# Patient Record
Sex: Female | Born: 1970 | Race: Black or African American | Hispanic: No | Marital: Married | Smoking: Never smoker
Health system: Southern US, Community
[De-identification: ages and names within clinical notes are randomized; demographics above are authoritative.]

## PROBLEM LIST (undated history)

## (undated) DIAGNOSIS — M1990 Unspecified osteoarthritis, unspecified site: Secondary | ICD-10-CM

## (undated) DIAGNOSIS — Z91018 Allergy to other foods: Secondary | ICD-10-CM

## (undated) DIAGNOSIS — R0602 Shortness of breath: Secondary | ICD-10-CM

## (undated) DIAGNOSIS — R7303 Prediabetes: Secondary | ICD-10-CM

## (undated) DIAGNOSIS — E119 Type 2 diabetes mellitus without complications: Secondary | ICD-10-CM

## (undated) DIAGNOSIS — T7840XA Allergy, unspecified, initial encounter: Secondary | ICD-10-CM

## (undated) DIAGNOSIS — I10 Essential (primary) hypertension: Secondary | ICD-10-CM

## (undated) DIAGNOSIS — E669 Obesity, unspecified: Secondary | ICD-10-CM

## (undated) DIAGNOSIS — M2550 Pain in unspecified joint: Secondary | ICD-10-CM

## (undated) DIAGNOSIS — R519 Headache, unspecified: Secondary | ICD-10-CM

## (undated) DIAGNOSIS — J45909 Unspecified asthma, uncomplicated: Secondary | ICD-10-CM

## (undated) DIAGNOSIS — N979 Female infertility, unspecified: Secondary | ICD-10-CM

## (undated) DIAGNOSIS — M549 Dorsalgia, unspecified: Secondary | ICD-10-CM

## (undated) DIAGNOSIS — K219 Gastro-esophageal reflux disease without esophagitis: Secondary | ICD-10-CM

## (undated) DIAGNOSIS — G43909 Migraine, unspecified, not intractable, without status migrainosus: Secondary | ICD-10-CM

## (undated) DIAGNOSIS — Z8719 Personal history of other diseases of the digestive system: Secondary | ICD-10-CM

## (undated) DIAGNOSIS — D649 Anemia, unspecified: Secondary | ICD-10-CM

## (undated) HISTORY — DX: Female infertility, unspecified: N97.9

## (undated) HISTORY — DX: Allergy, unspecified, initial encounter: T78.40XA

## (undated) HISTORY — DX: Prediabetes: R73.03

## (undated) HISTORY — DX: Unspecified osteoarthritis, unspecified site: M19.90

## (undated) HISTORY — DX: Migraine, unspecified, not intractable, without status migrainosus: G43.909

## (undated) HISTORY — PX: WISDOM TOOTH EXTRACTION: SHX21

## (undated) HISTORY — DX: Allergy to other foods: Z91.018

## (undated) HISTORY — DX: Anemia, unspecified: D64.9

## (undated) HISTORY — DX: Shortness of breath: R06.02

## (undated) HISTORY — DX: Dorsalgia, unspecified: M54.9

## (undated) HISTORY — DX: Pain in unspecified joint: M25.50

## (undated) HISTORY — DX: Gastro-esophageal reflux disease without esophagitis: K21.9

---

## 2014-07-16 DIAGNOSIS — R600 Localized edema: Secondary | ICD-10-CM | POA: Insufficient documentation

## 2014-07-16 DIAGNOSIS — J302 Other seasonal allergic rhinitis: Secondary | ICD-10-CM | POA: Insufficient documentation

## 2014-08-03 DIAGNOSIS — N920 Excessive and frequent menstruation with regular cycle: Secondary | ICD-10-CM | POA: Insufficient documentation

## 2014-08-03 DIAGNOSIS — G44209 Tension-type headache, unspecified, not intractable: Secondary | ICD-10-CM | POA: Insufficient documentation

## 2015-06-03 DIAGNOSIS — J9801 Acute bronchospasm: Secondary | ICD-10-CM | POA: Insufficient documentation

## 2015-06-17 DIAGNOSIS — E1165 Type 2 diabetes mellitus with hyperglycemia: Secondary | ICD-10-CM | POA: Insufficient documentation

## 2015-09-15 DIAGNOSIS — E785 Hyperlipidemia, unspecified: Secondary | ICD-10-CM | POA: Insufficient documentation

## 2016-04-24 ENCOUNTER — Encounter (HOSPITAL_COMMUNITY): Payer: Self-pay

## 2016-04-24 ENCOUNTER — Emergency Department (HOSPITAL_COMMUNITY)
Admission: EM | Admit: 2016-04-24 | Discharge: 2016-04-24 | Disposition: A | Discharge status: Home / Self Care | Payer: Self-pay | Attending: Dermatology | Admitting: Dermatology

## 2016-04-24 DIAGNOSIS — R51 Headache: Secondary | ICD-10-CM | POA: Insufficient documentation

## 2016-04-24 DIAGNOSIS — I10 Essential (primary) hypertension: Secondary | ICD-10-CM | POA: Insufficient documentation

## 2016-04-24 DIAGNOSIS — Z5321 Procedure and treatment not carried out due to patient leaving prior to being seen by health care provider: Secondary | ICD-10-CM | POA: Insufficient documentation

## 2016-04-24 HISTORY — DX: Essential (primary) hypertension: I10

## 2016-04-24 NOTE — ED Notes (Signed)
Not found in lobby x 3

## 2016-04-24 NOTE — ED Triage Notes (Signed)
Pt had headache and took excedrin tension medication.   Took with very small amount of water.  Felt pill get stuck at bottom of throat.  Pt started having burning in throat and upper chest. States episode lasted about a minute.  Pt has not had any pain since then.  Headache has even improved.  After mentioning to mother, mom told her to come here.  No pain or difficulty swallowing at this time.

## 2016-07-20 DIAGNOSIS — F4322 Adjustment disorder with anxiety: Secondary | ICD-10-CM | POA: Insufficient documentation

## 2016-11-02 DIAGNOSIS — M25561 Pain in right knee: Secondary | ICD-10-CM | POA: Insufficient documentation

## 2016-12-03 ENCOUNTER — Telehealth (INDEPENDENT_AMBULATORY_CARE_PROVIDER_SITE_OTHER): Payer: Self-pay

## 2016-12-03 NOTE — Telephone Encounter (Signed)
Patient would like a Rx refill of Vimovo.  Stated that she was getting [facility]  CB# is [phone].  Please Advise.  Thank You.

## 2016-12-03 NOTE — Telephone Encounter (Signed)
Ok to refill Vimovo?

## 2016-12-04 MED ORDER — NAPROXEN-ESOMEPRAZOLE 500-20 MG PO TBEC: DELAYED_RELEASE_TABLET | ORAL | 0 refills | Status: DC

## 2016-12-04 NOTE — Telephone Encounter (Signed)
Vimovo entered into system.I left voicemail for patient advising.

## 2016-12-04 NOTE — Telephone Encounter (Signed)
Ok thanks 

## 2017-06-17 ENCOUNTER — Other Ambulatory Visit: Payer: Self-pay

## 2017-06-17 ENCOUNTER — Emergency Department (HOSPITAL_COMMUNITY): Payer: 59

## 2017-06-17 ENCOUNTER — Encounter (HOSPITAL_COMMUNITY): Payer: Self-pay | Admitting: *Deleted

## 2017-06-17 ENCOUNTER — Emergency Department (HOSPITAL_COMMUNITY)
Admission: EM | Admit: 2017-06-17 | Discharge: 2017-06-18 | Disposition: A | Discharge status: Home / Self Care | Payer: 59 | Attending: Emergency Medicine | Admitting: Emergency Medicine

## 2017-06-17 DIAGNOSIS — R51 Headache: Secondary | ICD-10-CM | POA: Insufficient documentation

## 2017-06-17 DIAGNOSIS — I10 Essential (primary) hypertension: Secondary | ICD-10-CM | POA: Diagnosis present

## 2017-06-17 DIAGNOSIS — Z79899 Other long term (current) drug therapy: Secondary | ICD-10-CM | POA: Diagnosis not present

## 2017-06-17 HISTORY — DX: Obesity, unspecified: E66.9

## 2017-06-17 LAB — BASIC METABOLIC PANEL
Anion gap: 7 (ref 5–15)
BUN: 11 mg/dL (ref 6–20)
CALCIUM: 8.7 mg/dL — AB (ref 8.9–10.3)
CHLORIDE: 102 mmol/L (ref 101–111)
CO2: 29 mmol/L (ref 22–32)
CREATININE: 0.94 mg/dL (ref 0.44–1.00)
GFR calc non Af Amer: >60 mL/min (ref >60)
GLUCOSE: 180 mg/dL — AB (ref 65–99)
Potassium: 3.5 mmol/L (ref 3.5–5.1)
Sodium: 138 mmol/L (ref 135–145)

## 2017-06-17 LAB — CBC
HCT: 40.3 % (ref 36.0–46.0)
Hemoglobin: 13 g/dL (ref 12.0–15.0)
MCH: 27.5 pg (ref 26.0–34.0)
MCHC: 32.3 g/dL (ref 30.0–36.0)
MCV: 85.4 fL (ref 78.0–100.0)
Platelets: 276 10*3/uL (ref 150–400)
RBC: 4.72 MIL/uL (ref 3.87–5.11)
RDW: 13.5 % (ref 11.5–15.5)
WBC: 10.2 10*3/uL (ref 4.0–10.5)

## 2017-06-17 MED ORDER — LABETALOL HCL 5 MG/ML IV SOLN
10.0000 mg | Freq: Once | INTRAVENOUS | Status: AC
  Administered 2017-06-17: 10 mg via INTRAVENOUS
  Filled 2017-06-17: qty 4

## 2017-06-17 MED ORDER — LOSARTAN POTASSIUM 100 MG PO TABS: 100.0000 mg | ORAL_TABLET | Freq: Every day | ORAL | 0 refills | Status: DC

## 2017-06-17 NOTE — ED Triage Notes (Signed)
Pt reports going to pcp today due to headaches and sent here due to SBP > 200. Reports taking meds as prescribed for HTN.

## 2017-06-17 NOTE — ED Notes (Signed)
Patient transported to CT 

## 2017-06-17 NOTE — ED Notes (Signed)
Patient told to go to ED from her PCP office for further eval of high BP - pt states she had been taking her BP medication, but recently changed the time she was taking it. She states she's been waking up with headaches every morning for about 9-10 days and has been taking about 4 BC powders each day for that and for arthritic pain. Pt denies blurred vision/n/v, no dizziness/lightheadedness.

## 2017-06-17 NOTE — Discharge Instructions (Signed)
It was my pleasure taking care of you today!   We are going to increase your Losartan to 100mg  daily. I have given you a prescription for this. Continue taking all your other home medications as directed.   Call your primary care doctor in the morning to schedule a follow up appointment.   Return to ER for new or worsening symptoms, any additional concerns.

## 2017-06-17 NOTE — ED Provider Notes (Signed)
[facility] EMERGENCY [facility] Provider Note   CSN: [id number] Arrival date & time: 06/17/17  1426     History   Chief Complaint Chief Complaint  Patient presents with  . Hypertension    HPI Maureen Flores is a 46 y.o. female.  The history is provided by the patient and medical records. No language interpreter was used.     Maureen Flores is a 46 y.o. female  with a PMH of HTN, obesity who presents to the Emergency Department from PCP office. Patient states that over the last week, she has been waking up with headaches. She will take ibuprofen or BC and headache will resolve. She will have intermittent headaches throughout the day, but states they are very mild, not requiring medication and more of an "annoyance". When she saw her PCP today, she notes blood pressure was markedly elevated with systolic BP in the 200's. She states that her BP normally is 140-150's/90's range. She is on Bystolic, losartan and triamterene for BP which she has been taking regularly. 1.5 weeks ago, she started taking medication in the morning instead of at night because diuretic was causing her to wake up several times to use the bathroom during the night, but she still denies any days where she has missed her medication. No chest pain, shortness of breath, abdominal pain, back pain, blurred vision / visual changes.   Past Medical History:  Diagnosis Date  . Hypertension   . Obesity     There are no active problems to display for this patient.   History reviewed. No pertinent surgical history.  OB History    No data available       Home Medications    Prior to Admission medications   Medication Sig Start Date End Date Taking? Authorizing Provider  losartan (COZAAR) 100 MG tablet Take 1 tablet (100 mg total) daily by mouth. 06/17/17   Axil Copeman, Chase PicketJaime Pilcher, PA-C  Naproxen-Esomeprazole 500-20 MG TBEC Take one tablet every 12 hours with food as needed. 12/04/16   Eldred MangesYates, Mark C, MD     Family History History reviewed. No pertinent family history.  Social History Social History   Tobacco Use  . Smoking status: Never Smoker  . Smokeless tobacco: Never Used  Substance Use Topics  . Alcohol use: Yes    Comment: social  . Drug use: No     Allergies   Patient has no known allergies.   Review of Systems Review of Systems  Neurological: Positive for headaches. Negative for dizziness, speech difficulty, weakness, light-headedness and numbness.  All other systems reviewed and are negative.    Physical Exam Updated Vital Signs BP (!) 205/92   Pulse 63   Temp 98.8 F (37.1 C) (Oral)   Resp 17   SpO2 96%   Physical Exam  Constitutional: She is oriented to person, place, and time. She appears well-developed and well-nourished. No distress.  HENT:  Head: Normocephalic and atraumatic.  Cardiovascular: Normal rate, regular rhythm and normal heart sounds.  No murmur heard. Pulmonary/Chest: Effort normal and breath sounds normal. No respiratory distress.  Abdominal: Soft. She exhibits no distension. There is no tenderness.  Musculoskeletal: She exhibits no edema.  Neurological: She is alert and oriented to person, place, and time.  Speech clear and goal oriented. CN 2-12 grossly intact. Normal finger-to-nose and rapid alternating movements. No drift. Strength and sensation intact. Steady gait.  Skin: Skin is warm and dry.  Nursing note and vitals reviewed.  ED Treatments / Results  Labs (all labs ordered are listed, but only abnormal results are displayed) Labs Reviewed  BASIC METABOLIC PANEL - Abnormal; Notable for the following components:      Result Value   Glucose, Bld 180 (*)    Calcium 8.7 (*)    All other components within normal limits  CBC    EKG  EKG Interpretation None       Radiology Ct Head Wo Contrast  Result Date: 06/17/2017 CLINICAL DATA:  Headaches every morning this week. Elevated blood pressure at primary care  physician. EXAM: CT HEAD WITHOUT CONTRAST TECHNIQUE: Contiguous axial images were obtained from the base of the skull through the vertex without intravenous contrast. COMPARISON:  None. FINDINGS: Brain: No evidence of acute infarction, hemorrhage, hydrocephalus, extra-axial collection or mass lesion/mass effect. Vascular: Vascular calcifications in the internal carotid vessels. Skull: Calvarium appears intact. Sinuses/Orbits: Mucosal thickening of the left paranasal sinuses. No acute air-fluid levels. Mastoid air cells are not opacified. Other: None. IMPRESSION: No acute intracranial abnormalities. Chronic appearing inflammatory changes in the left paranasal sinuses. Electronically Signed   By: Burman NievesWilliam  Stevens M.D.   On: 06/17/2017 22:45    Procedures Procedures (including critical care time)  Medications Ordered in ED Medications  labetalol (NORMODYNE,TRANDATE) injection 10 mg (10 mg Intravenous Given 06/17/17 2256)     Initial Impression / Assessment and Plan / ED Course  I have reviewed the triage vital signs and the nursing notes.  Pertinent labs & imaging results that were available during my care of the patient were reviewed by me and considered in my medical decision making (see chart for details).    Maureen Flores is a 46 y.o. female who presents to ED from PCP for hypertension associated with morning headaches x 1 week. No focal neuro deficits on exam. Labs with no evidence of end-organ damage. CT head negative. Dose of labetalol given and BP down to 160's/90's. Patient on 50mg  losartan along with two other anti-hypertensive agents. Will increase to 100mg . Patient has PCP whom she follows up with regularly and feels comfortable with plan to discharge home with close PCP follow up. Reasons to return to ER discussed. All questions answered.   Patient discussed with Dr. Effie ShyWentz who agrees with treatment plan.   Final Clinical Impressions(s) / ED Diagnoses   Final diagnoses:  Essential  hypertension    ED Discharge Orders        Ordered    losartan (COZAAR) 100 MG tablet  Daily     06/17/17 2345       Teliah Buffalo, Chase PicketJaime Pilcher, PA-C 06/17/17 2353    Mancel BaleWentz, Elliott, MD 06/18/17 [phone]

## 2017-08-21 DIAGNOSIS — G8929 Other chronic pain: Secondary | ICD-10-CM | POA: Insufficient documentation

## 2017-11-20 IMAGING — CT CT HEAD W/O CM
4 series · 16 of 47 positions shown, 18 images · non-contrast
Comparison: None.

CLINICAL DATA: Headaches every morning this week. Elevated blood
pressure at primary care physician.

EXAM:
CT HEAD WITHOUT CONTRAST
TECHNIQUE: Contiguous axial images were obtained from the base of the skull
through the vertex without intravenous contrast.

[Series 3: head wo · axial · 0.47mm/px · z∈[+964,+1080]mm · 7 of 31 slices shown, 9 images]
[im 4/31  brain]
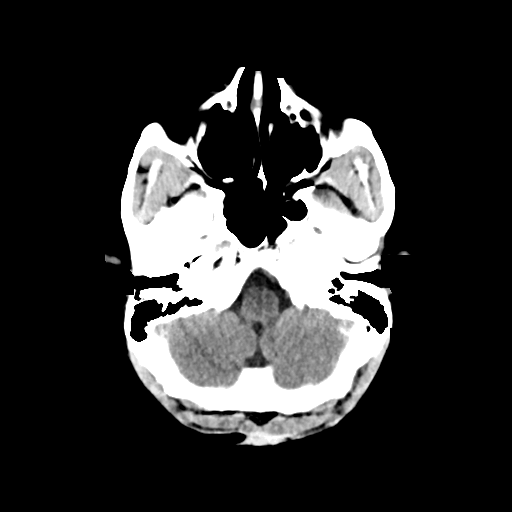
[im 4/31  bone]
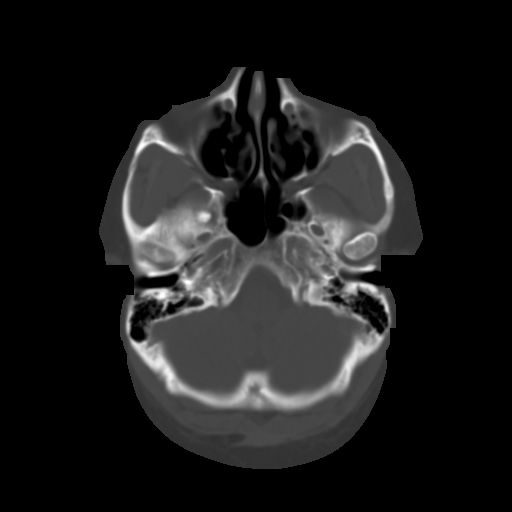
[im 8/31  brain]
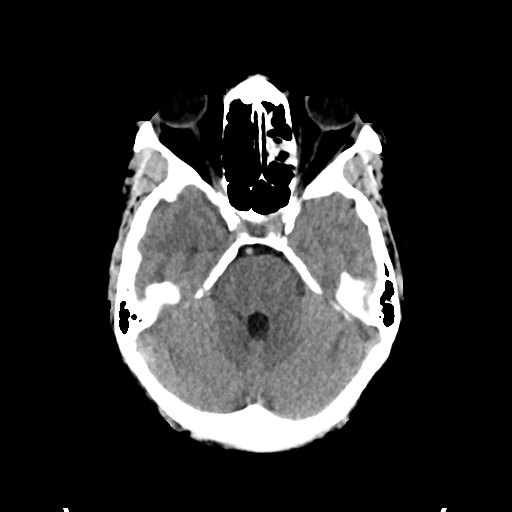
[im 12/31  brain]
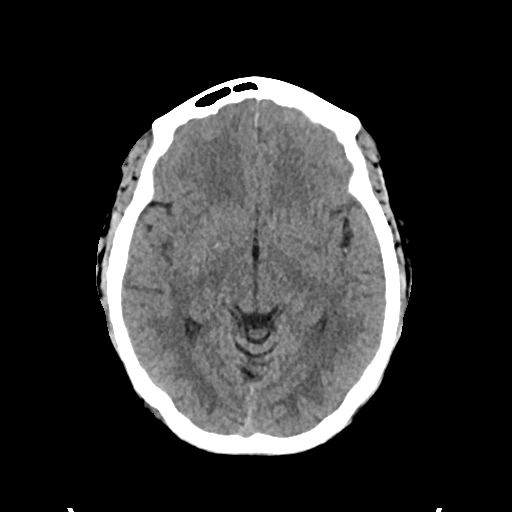
[im 16/31  brain]
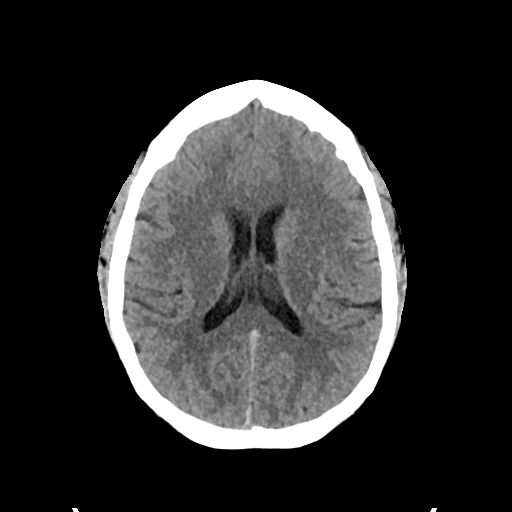
[im 19/31  brain]
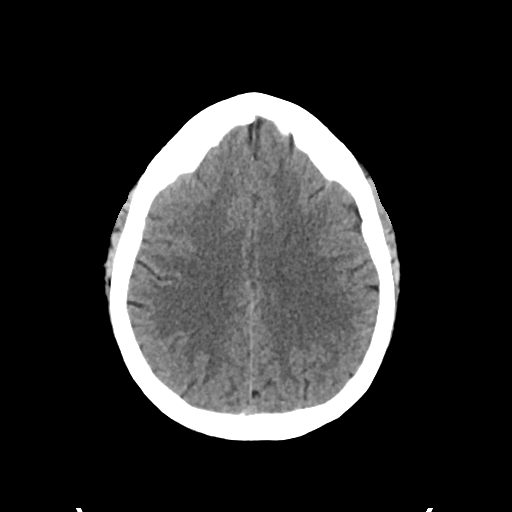
[im 19/31  bone]
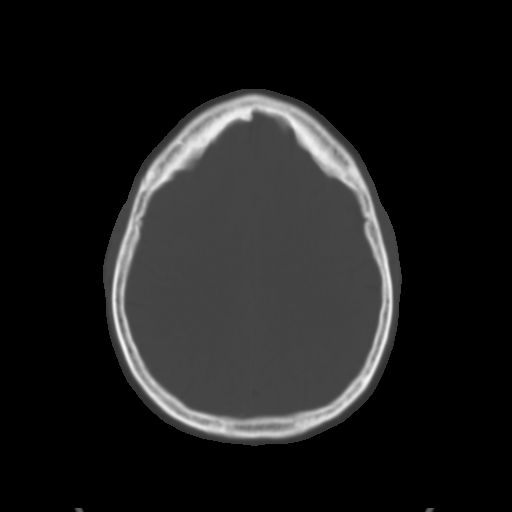
[im 23/31  brain]
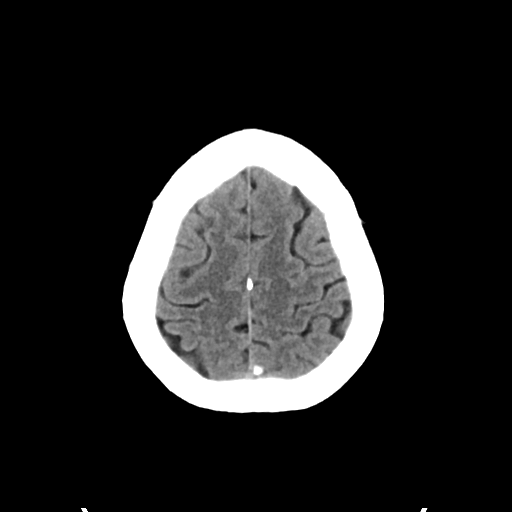
[im 27/31  brain]
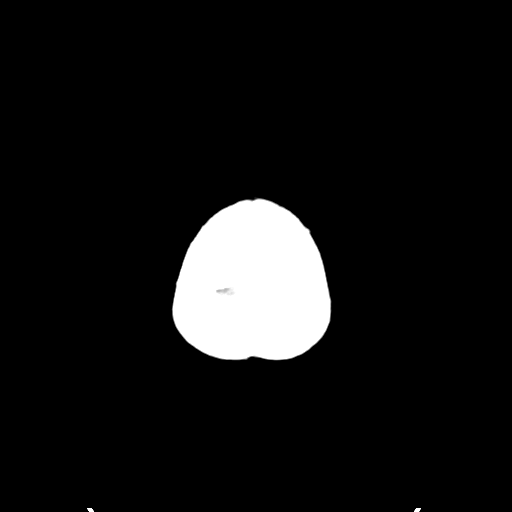

[Series 4: head bone · axial · 0.47mm/px · z∈[+964,+994]mm · 3 of 76 slices shown]
[im 8/76  bone]
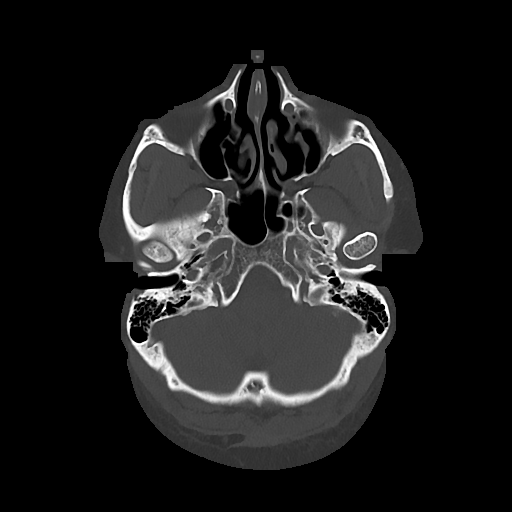
[im 16/76  bone]
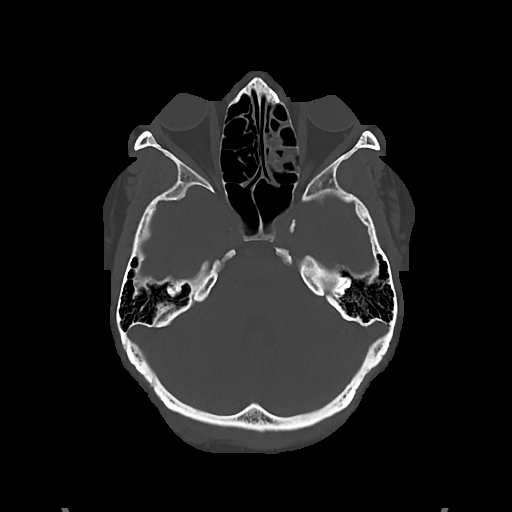
[im 23/76  bone]
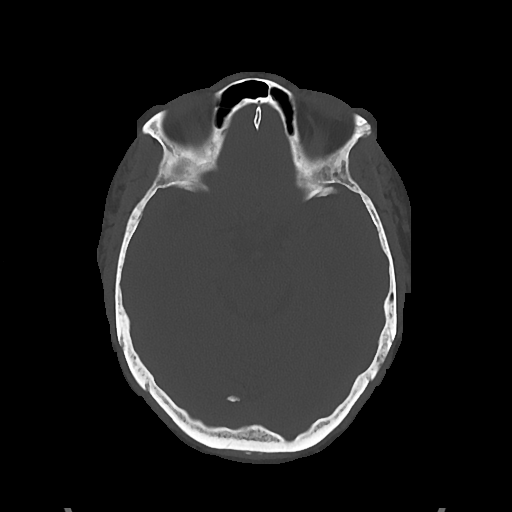

[Series 5: cor soft · coronal · 0.30mm/px · 3 of 71 slices shown]
[im 24/71  brain]
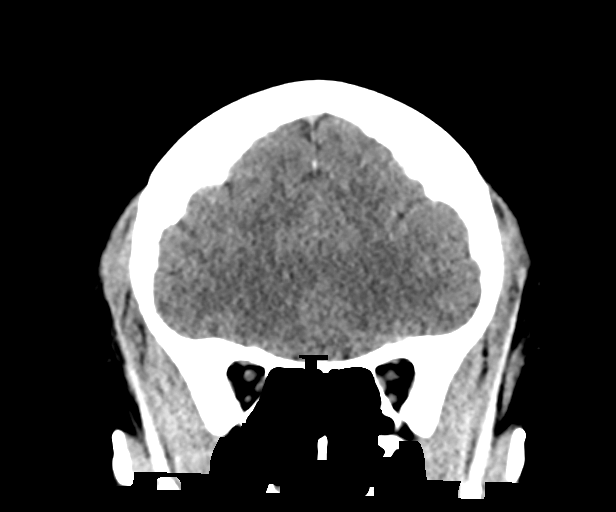
[im 32/71  brain]
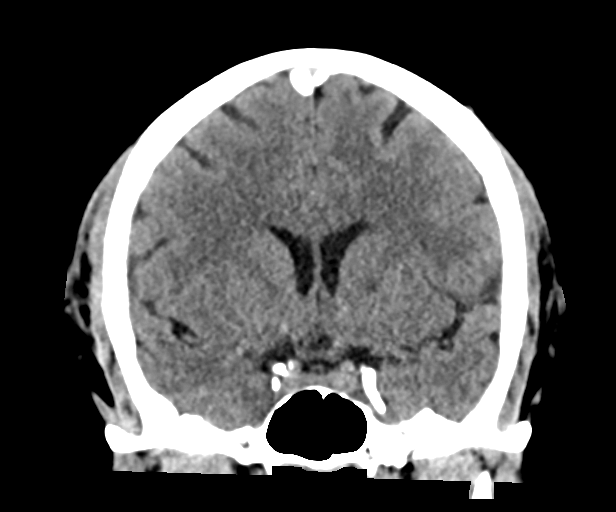
[im 39/71  brain]
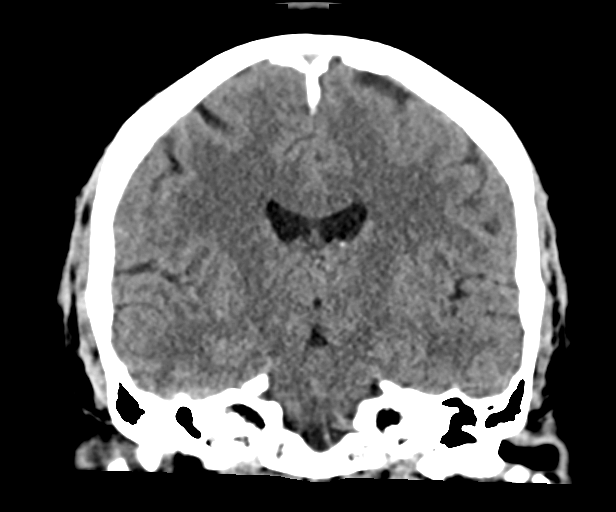

[Series 6: sag soft · sagittal · 0.31mm/px · 3 of 67 slices shown]
[im 23/67  brain]
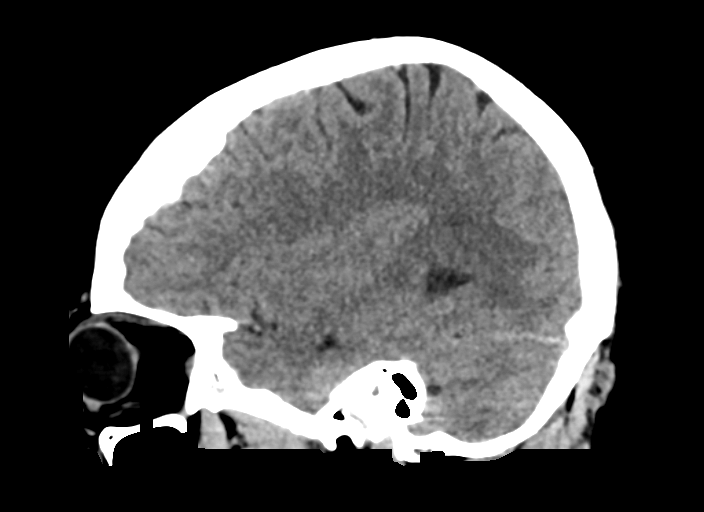
[im 34/67  brain]
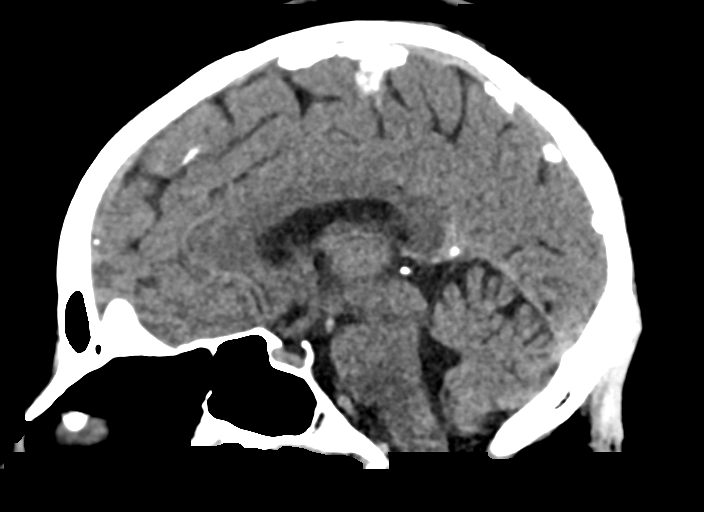
[im 45/67  brain]
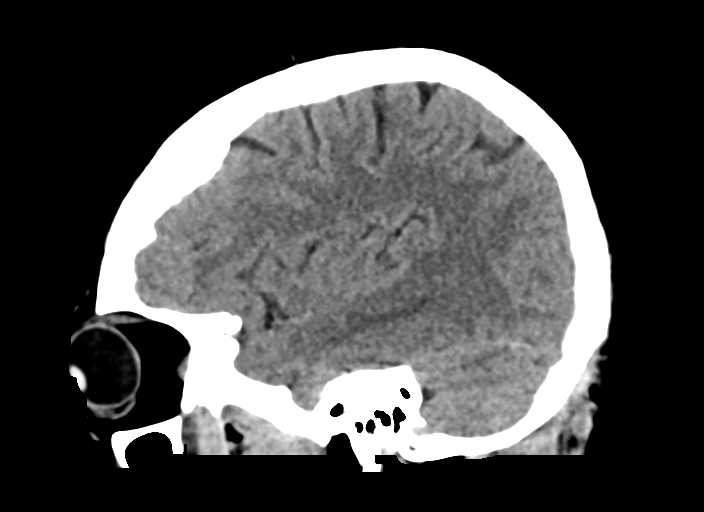

[16 of 47 positions shown; findings below may reference images not displayed]

FINDINGS: Brain: No evidence of acute infarction, hemorrhage, hydrocephalus,
extra-axial collection or mass lesion/mass effect.

Vascular: Vascular calcifications in the internal carotid vessels.

Skull: Calvarium appears intact.

Sinuses/Orbits: Mucosal thickening of the left paranasal sinuses. No
acute air-fluid levels. Mastoid air cells are not opacified.

Other: None.
IMPRESSION: No acute intracranial abnormalities. Chronic appearing inflammatory
changes in the left paranasal sinuses.

## 2018-10-09 DIAGNOSIS — M25551 Pain in right hip: Secondary | ICD-10-CM | POA: Insufficient documentation

## 2018-10-09 DIAGNOSIS — M545 Low back pain, unspecified: Secondary | ICD-10-CM | POA: Insufficient documentation

## 2018-10-09 DIAGNOSIS — M1711 Unilateral primary osteoarthritis, right knee: Secondary | ICD-10-CM | POA: Insufficient documentation

## 2019-02-17 DIAGNOSIS — M92511 Juvenile osteochondrosis of proximal tibia, right leg: Secondary | ICD-10-CM | POA: Insufficient documentation

## 2019-11-18 DIAGNOSIS — G43919 Migraine, unspecified, intractable, without status migrainosus: Secondary | ICD-10-CM | POA: Insufficient documentation

## 2020-04-19 DIAGNOSIS — M1712 Unilateral primary osteoarthritis, left knee: Secondary | ICD-10-CM | POA: Insufficient documentation

## 2020-12-07 ENCOUNTER — Encounter: Payer: Self-pay | Admitting: Neurology

## 2020-12-09 DIAGNOSIS — J45901 Unspecified asthma with (acute) exacerbation: Secondary | ICD-10-CM | POA: Insufficient documentation

## 2020-12-14 NOTE — Progress Notes (Signed)
NEUROLOGY CONSULTATION NOTE  Maureen Flores MRN: [id number] DOB: 07-08-1971  Referring provider: Bernadette Hoit, MD Primary care provider: Bernadette Hoit, MD  Reason for consult:  headaches  Assessment/Plan:   1.  Chronic migraine without aura, without status migrainosus, intractable 2.  Low back pain with right sided sciatica 3.  Excessive daytime sleepiness 4.  Hypertension - blood pressure elevated today.  Has not yet taken her medication 5.  Morbid obesity  1.  Start topiramate 25mg  at bedtime for one week, then 50mg  at bedtime.  We can increase to 100mg  at bedtime in 4-5 weeks if needed.  Side effects discussed 2.  For migraine rescue, rizatriptan and Zofran 3.  Advised to stop Excedrin.  Discussed risk of rebound/medication-overuse headache 4.  Keep headache diary 5.  Refer to PT for back pain and sciatica 6.  Sleep study to evaluate for OSA 7.  Patient should take her blood pressure medication 8.  Discussed diet/hydration 9.  Follow up 6 months.   Subjective:  Maureen Flores is a 50 year old female with HTN and type 2 diabetes mellitus who presents for headaches.  History supplemented by prior neurologist's and referring provider's notes.   She has history of mild headaches for several years.  They occurred 2-3 days a week and responded well to naproxen or Goody's.  After she had Covid in December 2020, the headaches became daily.  Currently they are severe occipital or bi-frontotemporal pain lasting 1 to 2 days and occurring 1 to 2 times a week.  There is associated with bilateral eye pain, nausea, photophobia and phonophobia.  She initially was taking naproxen and Goody's daily, then Excedrin.  Excedrin eases the pain but does not abort it.  CT of head on 08/28/2019 showed slight calcification in the right carotid siphon region but no acute intracranial abnormality.   Prior CT head on 06/17/2017 personally reviewed showed no acute intracranial abnormality.  Current  NSAIDS/analgesics:  Excedrin Current triptans:  none Current ergotamine:  none Current anti-emetic:  none Current muscle relaxants:  none Current Antihypertensive medications:  Amlodipine, losartan, triamterene-HCTZ, Current Antidepressant medications:  none Current Anticonvulsant medications:  Gabapentin 800mg  TID (arthritic pain) Current anti-CGRP:  none Current Vitamins/Herbal/Supplements:  none Current Antihistamines/Decongestants:  Flonase Other therapy:  none Hormone/birth control:  none  Past NSAIDS/analgesics:  Tramadol, naproxen, Goody's Past abortive triptans:  none Past abortive ergotamine:  none Past muscle relaxants:  none Past anti-emetic:  Compazine Past antihypertensive medications:  Amlodipine, nebivolol, carvedilol, losartain, furosemide Past antidepressant medications:  Duloxetine 90mg  QD Past anticonvulsant medications:  topiramate Past anti-CGRP:  none Past vitamins/Herbal/Supplements:  none Past antihistamines/decongestants:  none Other past therapies:  none  Caffeine:  Cut back on tea -2-3 times a week.  No coffee Diet:  Hydrates.  Eats one meal a day but may also drink a smoothie or eat granola during the day Exercise:  no Depression:  no Other pain:  Knee pain, athritis.  She has chronic low back pain and reports flare up of right sided sciatica Sleep hygiene:  OK.  Gets 6-8 hours a night. Sometimes fatigued during the day.       PAST MEDICAL HISTORY: Past Medical History:  Diagnosis Date  . Hypertension   . Obesity     PAST SURGICAL HISTORY: No past surgical history on file.  MEDICATIONS: Current Outpatient Medications on File Prior to Visit  Medication Sig Dispense Refill  . losartan (COZAAR) 100 MG tablet Take 1 tablet (100 mg total) daily by mouth.  30 tablet 0  . Naproxen-Esomeprazole 500-20 MG TBEC Take one tablet every 12 hours with food as needed. 60 tablet 0   No current facility-administered medications on file prior to visit.     ALLERGIES: No Known Allergies  FAMILY HISTORY: No family history on file.  Objective:  Blood pressure (!) 185/97, pulse 74, resp. rate 20, height 5\' 6"  (1.676 m), weight (!) 416 lb (188.7 kg), SpO2 98 %. General: No acute distress.  Patient appears well-groomed.   Head:  Normocephalic/atraumatic Eyes:  fundi examined but not visualized Neck: supple, no paraspinal tenderness, full range of motion Back: No paraspinal tenderness Heart: regular rate and rhythm Lungs: Clear to auscultation bilaterally. Vascular: No carotid bruits. Neurological Exam: Mental status: alert and oriented to person, place, and time, recent and remote memory intact, fund of knowledge intact, attention and concentration intact, speech fluent and not dysarthric, language intact. Cranial nerves: CN I: not tested CN II: pupils equal, round and reactive to light, visual fields intact CN III, IV, VI:  full range of motion, no nystagmus, no ptosis CN V: facial sensation intact. CN VII: upper and lower face symmetric CN VIII: hearing intact CN IX, X: gag intact, uvula midline CN XI: sternocleidomastoid and trapezius muscles intact CN XII: tongue midline Bulk & Tone: normal, no fasciculations. Motor:  muscle strength 5/5 throughout Sensation:  Pinprick, temperature and vibratory sensation intact. Deep Tendon Reflexes:  2+ throughout,  toes downgoing.   Finger to nose testing:  Without dysmetria.   Heel to shin:  Without dysmetria.   Gait:  Normal station and stride.  Romberg negative.    Thank you for allowing me to take part in the care of this patient.  , DO  CC: Shon Millet, MD

## 2020-12-15 ENCOUNTER — Encounter: Payer: Self-pay | Admitting: Neurology

## 2020-12-15 ENCOUNTER — Other Ambulatory Visit: Payer: Self-pay

## 2020-12-15 ENCOUNTER — Ambulatory Visit: Payer: 59 | Admitting: Neurology

## 2020-12-15 VITALS — BP 185/97 | HR 74 | Resp 20 | Ht 66.0 in | Wt >= 6400 oz

## 2020-12-15 DIAGNOSIS — G4719 Other hypersomnia: Secondary | ICD-10-CM | POA: Diagnosis not present

## 2020-12-15 DIAGNOSIS — I10 Essential (primary) hypertension: Secondary | ICD-10-CM

## 2020-12-15 DIAGNOSIS — G8929 Other chronic pain: Secondary | ICD-10-CM

## 2020-12-15 DIAGNOSIS — M5441 Lumbago with sciatica, right side: Secondary | ICD-10-CM | POA: Diagnosis not present

## 2020-12-15 DIAGNOSIS — G43709 Chronic migraine without aura, not intractable, without status migrainosus: Secondary | ICD-10-CM | POA: Diagnosis not present

## 2020-12-15 MED ORDER — RIZATRIPTAN BENZOATE 10 MG PO TBDP: 10.0000 mg | ORAL_TABLET | ORAL | 5 refills | Status: DC | PRN

## 2020-12-15 MED ORDER — TOPIRAMATE 50 MG PO TABS: ORAL_TABLET | ORAL | 0 refills | Status: DC

## 2020-12-15 MED ORDER — ONDANSETRON 4 MG PO TBDP: 4.0000 mg | ORAL_TABLET | Freq: Three times a day (TID) | ORAL | 5 refills | Status: DC | PRN

## 2020-12-15 NOTE — Patient Instructions (Signed)
  1. Start topiramate 50mg :  Take 1/2 tablet at bedtime for one week, then 1 tablet at bedtime  Contact in 5 weeks with update and we can increase dose if needed. 2. Take rizatriptan at earliest onset of headache.  May repeat dose once in 2 hours if needed.  Maximum 2 tablets in 24 hours. 3. Take ondansetron for nausea 4. Stop Excedrin 5. Limit use of pain relievers to no more than 2 days out of the week.  These medications include acetaminophen, NSAIDs (ibuprofen/Advil/Motrin, naproxen/Aleve, triptans (Imitrex/sumatriptan), Excedrin, and narcotics.  This will help reduce risk of rebound headaches. 6. Be aware of common food triggers:  - Caffeine:  coffee, black tea, cola, Mt. Dew  - Chocolate  - Dairy:  aged cheeses (brie, blue, cheddar, gouda, [place], provolone, [place], Swiss, etc), chocolate milk, buttermilk, sour cream, limit eggs and yogurt  - Nuts, peanut butter  - Alcohol  - Cereals/grains:  FRESH breads (fresh bagels, sourdough, doughnuts), yeast productions  - Processed/canned/aged/cured meats (pre-packaged deli meats, hotdogs)  - MSG/glutamate:  soy sauce, flavor enhancer, pickled/preserved/marinated foods  - Sweeteners:  aspartame (Equal, Nutrasweet).  Sugar and Splenda are okay  - Vegetables:  legumes (lima beans, lentils, snow peas, fava beans, pinto peans, peas, garbanzo beans), sauerkraut, onions, olives, pickles  - Fruit:  avocados, bananas, citrus fruit (orange, lemon, grapefruit), mango  - Other:  Frozen meals, macaroni and cheese 7. Routine exercise 8. Stay adequately hydrated (aim for 64 oz water daily) 9. Keep headache diary 10. Maintain proper stress management 11. Maintain proper sleep hygiene 12. Do not skip meals 13. Consider supplements:  magnesium citrate 400mg  daily, riboflavin 400mg  daily, coenzyme Q10 100mg  three times daily. 14.  Sleep study 15.  Refer to physical therapy for right sided sciatica

## 2021-01-02 ENCOUNTER — Other Ambulatory Visit (HOSPITAL_COMMUNITY): Payer: Self-pay | Admitting: Surgery

## 2021-01-02 ENCOUNTER — Other Ambulatory Visit: Payer: Self-pay | Admitting: Surgery

## 2021-01-07 ENCOUNTER — Other Ambulatory Visit: Payer: Self-pay | Admitting: Neurology

## 2021-01-13 ENCOUNTER — Ambulatory Visit (HOSPITAL_COMMUNITY): Payer: 59

## 2021-01-20 ENCOUNTER — Ambulatory Visit (HOSPITAL_COMMUNITY)
Admission: RE | Admit: 2021-01-20 | Discharge: 2021-01-20 | Disposition: A | Discharge status: Home / Self Care | Payer: 59 | Source: Ambulatory Visit | Attending: Surgery | Admitting: Surgery

## 2021-01-20 ENCOUNTER — Other Ambulatory Visit: Payer: Self-pay

## 2021-02-02 ENCOUNTER — Other Ambulatory Visit: Payer: Self-pay

## 2021-02-02 DIAGNOSIS — G4719 Other hypersomnia: Secondary | ICD-10-CM

## 2021-02-05 ENCOUNTER — Other Ambulatory Visit: Payer: Self-pay | Admitting: Neurology

## 2021-02-06 NOTE — Telephone Encounter (Signed)
Please advise 

## 2021-02-08 ENCOUNTER — Encounter: Payer: 59 | Attending: Surgery | Admitting: Skilled Nursing Facility1

## 2021-02-08 ENCOUNTER — Other Ambulatory Visit: Payer: Self-pay

## 2021-02-08 DIAGNOSIS — E119 Type 2 diabetes mellitus without complications: Secondary | ICD-10-CM | POA: Insufficient documentation

## 2021-02-08 DIAGNOSIS — E669 Obesity, unspecified: Secondary | ICD-10-CM | POA: Diagnosis present

## 2021-02-08 NOTE — Progress Notes (Signed)
Nutrition Assessment for Bariatric Surgery Medical Nutrition Therapy Appt Start Time: 8:40    End Time: 9:45  Patient was seen on 02/08/2021 for Pre-Operative Nutrition Assessment. Letter of approval faxed to [facility] bariatric surgery program coordinator on 02/08/2021.   Referral stated Supervised Weight Loss (SWL) visits needed: 0  Pt completed visits.   Pt has cleared nutrition requirements.    Planned surgery: sleeve gastrectomy  Pt expectation of surgery: to get off medications Pt expectation of dietitian: to help educate     NUTRITION ASSESSMENT   Anthropometrics  Start weight at [facility]: 408 lbs (date: 02/08/2021)  Height: 66 in BMI: 66.86 kg/m2     Clinical  Medical hx: HTN, arthritis, diabetes Medications: see list; actos, metformin  Labs: HDL 29, A1C 8.1 Notable signs/symptoms: none stated Any previous deficiencies? No  Micronutrient Nutrition Focused Physical Exam: Hair: No issues observed Eyes: No issues observed Mouth: No issues observed Neck: No issues observed Nails: No issues observed Skin: No issues observed  Lifestyle & Dietary Hx  Pt states from seeing her thinner friends having health issues that weight does not dictate health. Pt states she does not compare herself to others.  Pt states her metformin makes her feel sluggish and has stomach distress: dietitian advised pt to take her metformin on a full stomach.  Pt states she has cut back on her sweet tea and increased her water intake.    24-Hr Dietary Recall First Meal: skipped Snack:  Second Meal: noodles or greek yogurt Snack:  Third Meal: chicken or pork or beef + steamed vegetables or salad Snack:  Beverages: sweet tea, water, gatorade   Estimated Energy Needs Calories: 1500  NUTRITION DIAGNOSIS  Overweight/obesity ([place]-3.3) related to past poor dietary habits and physical inactivity as evidenced by patient w/ planned sleeve gastrectomy  surgery following dietary  guidelines for continued weight loss.    NUTRITION INTERVENTION  Nutrition counseling (C-1) and education (E-2) to facilitate bariatric surgery goals.  Educated pt on micronutrient deficiencies post surgery and strategies to mitigate that risk Educated pt on diabetes management and blood sugar control    Pre-Op Goals Reviewed with the Patient Track food and beverage intake (pen and paper, MyFitness Pal, Baritastic app, etc.) Make healthy food choices while monitoring portion sizes Consume 3 meals per day or try to eat every 3-5 hours Avoid concentrated sugars and fried foods Keep sugar & fat in the single digits per serving on food labels Practice CHEWING your food (aim for applesauce consistency) Practice not drinking 15 minutes before, during, and 30 minutes after each meal and snack Avoid all carbonated beverages (ex: soda, sparkling beverages)  Limit caffeinated beverages (ex: coffee, tea, energy drinks) Avoid all sugar-sweetened beverages (ex: regular soda, sports drinks)  Avoid alcohol  Aim for 64-100 ounces of FLUID daily (with at least half of fluid intake being plain water)  Aim for at least 60-80 grams of PROTEIN daily Look for a liquid protein source that contains ?15 g protein and ?5 g carbohydrate (ex: shakes, drinks, shots) Make a list of non-food related activities Physical activity is an important part of a healthy lifestyle so keep it moving! The goal is to reach 150 minutes of exercise per week, including cardiovascular and weight baring activity. Do not drink regular Gatorade   *Goals that are bolded indicate the pt would like to start working towards these  Handouts Provided Include  Bariatric Surgery handouts (Nutrition Visits, Pre-Op Goals, Protein Shakes, Vitamins & Minerals)  Learning Style &  Readiness for Change Teaching method utilized: Visual & Auditory  Demonstrated degree of understanding via: Teach Back  Readiness Level: Action Barriers to  learning/adherence to lifestyle change: none stated     MONITORING & EVALUATION Dietary intake, weekly physical activity, body weight, and pre-op goals reached at next nutrition visit.    Next Steps  Patient is to follow up at [facility] for Pre-Op Class >2 weeks before surgery for further nutrition education.  Pt has completed visits. No further supervised visits required/recomended

## 2021-03-14 ENCOUNTER — Encounter (HOSPITAL_COMMUNITY): Admission: RE | Admit: 2021-03-14 | Payer: 59 | Source: Ambulatory Visit

## 2021-03-14 ENCOUNTER — Other Ambulatory Visit (HOSPITAL_COMMUNITY): Payer: 59

## 2021-03-20 ENCOUNTER — Other Ambulatory Visit: Payer: Self-pay

## 2021-03-20 ENCOUNTER — Ambulatory Visit: Payer: Self-pay | Admitting: Surgery

## 2021-03-20 ENCOUNTER — Encounter: Payer: 59 | Attending: Surgery | Admitting: Skilled Nursing Facility1

## 2021-03-20 DIAGNOSIS — E669 Obesity, unspecified: Secondary | ICD-10-CM | POA: Insufficient documentation

## 2021-03-20 NOTE — H&P (View-Only) (Signed)
Surgical Evaluation  Chief Complaint: morbid obesity  HPI:  She returns for follow-up regarding surgical treatment of severe obesity.  Reports no changes in her health since our initial meeting 3 months ago.  She has completed the bariatric pathway with no barriers identified. Has several insightful questions.  She has lost 15lb since our first visit!   UGI/CXR: 01/20/21 negative, no mention of hh Dietician: approved Counsellor) Psychologist: approved (01/19/21 [employer], Ronette Deter LCSW LCAS [facility]) Labs: H pylori +- treated. Folate/ thiamine was borderline, HgA1C 8.1  Initial visit 12/28/20: This is a very pleasant 50 year old woman with chief complaint of morbid obesity  She has trouble with this for her entire life.  She has tried numerous diets, exercise plans, as well as medications each with limited and transient success.  Her highest adult weight is 425 pounds and she is currently 415 pounds. Most successful attempt was with saxenda which allowed her to lose 50 pounds, much of which she regained when she stopped taking the medication.  She had been doing water aerobics and Jazzercise classes but had to stop these when the pandemic began.  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  She is interested in losing weight in order to improve her health, improve her mobility, and improve her quality of life as well as to avoid developing further complications of obesity.  She has recently joined a facebook support group and has found that extremely enlightening and encouraging.  She has a history of migraine headaches, hypertension, osteoarthritis.  Previous abdominal surgery of C-section.  She lives at home with her husband.  They have 2 grown children, her daughter lives in [place] and son is 61 years old.  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  Denies tobacco, alcohol or drug use.  415lb/ bmi 69  No Known Allergies  Past  Medical History:  Diagnosis Date   Hypertension    Obesity    Osteoarthritis     No past surgical history on file.  No family history on file.  Social History   Socioeconomic History   Marital status: Married    Spouse name: Not on file   Number of children: 1   Years of education: 16   Highest education level: Not on file  Occupational History   Not on file  Tobacco Use   Smoking status: Never   Smokeless tobacco: Never  Vaping Use   Vaping Use: Not on file  Substance and Sexual Activity   Alcohol use: Yes    Comment: social   Drug use: No   Sexual activity: Not on file  Other Topics Concern   Not on file  Social History Narrative   Right handed   Drinks caffeine   Two story home   Social Determinants of Health   Financial Resource Strain: Not on file  Food Insecurity: Not on file  Transportation Needs: Not on file  Physical Activity: Not on file  Stress: Not on file  Social Connections: Not on file    Current Outpatient Medications on File Prior to Visit  Medication Sig Dispense Refill   albuterol (VENTOLIN HFA) 108 (90 Base) MCG/ACT inhaler albuterol sulfate HFA 90 mcg/actuation aerosol inhaler  INHALE 2 PUFFS INTO THE LUNGS EVERY 6 HOURS AS NEEDED FOR WHEEZING     amLODipine (NORVASC) 10 MG tablet amlodipine 10 mg tablet  TAKE 1 TABLET BY MOUTH EVERY DAY     aspirin-acetaminophen-caffeine (EXCEDRIN  MIGRAINE) 250-250-65 MG tablet Take by mouth every 6 (six) hours as needed for headache.     carvedilol (COREG) 25 MG tablet Take 25 mg by mouth 2 (two) times daily with a meal.     DULoxetine (CYMBALTA) 30 MG capsule Take 30 mg by mouth. 1 tab tid     furosemide (LASIX) 20 MG tablet Take 20 mg by mouth.     gabapentin (NEURONTIN) 800 MG tablet Take 800 mg by mouth 3 (three) times daily.     losartan (COZAAR) 100 MG tablet Take 1 tablet (100 mg total) daily by mouth. 30 tablet 0   Naproxen-Esomeprazole 500-20 MG TBEC Take one tablet every 12 hours with food  as needed. (Patient not taking: Reported on 12/15/2020) 60 tablet 0   ondansetron (ZOFRAN ODT) 4 MG disintegrating tablet Take 1 tablet (4 mg total) by mouth every 8 (eight) hours as needed for nausea or vomiting. 20 tablet 5   pioglitazone (ACTOS) 30 MG tablet Take 30 mg by mouth daily.     rizatriptan (MAXALT-MLT) 10 MG disintegrating tablet Take 1 tablet (10 mg total) by mouth as needed for migraine (May repeat in 2 hours.  Max 2 tablets in 24 hours). May repeat in 2 hours if needed 10 tablet 5   simvastatin (ZOCOR) 10 MG tablet Take 10 mg by mouth daily.     topiramate (TOPAMAX) 50 MG tablet Take 1 tablet (50 mg total) by mouth at bedtime. Take 1/2 tablet at bedtime for one week, then increase to 1 tablet at bedtime 30 tablet 5   Triamterene-HCTZ (MAXZIDE-25 PO) Take by mouth.     No current facility-administered medications on file prior to visit.    Review of Systems: a complete, 10pt review of systems was completed with pertinent positives and negatives as documented in the HPI  Physical Exam: There were no vitals filed for this visit. Gen: A&Ox3, no distress  Unlabored respirations   CBC Latest Ref Rng & Units 06/17/2017  WBC 4.0 - 10.5 K/uL 10.2  Hemoglobin 12.0 - 15.0 g/dL 63.8  Hematocrit 45.3 - 46.0 % 40.3  Platelets 150 - 400 K/uL 276    CMP Latest Ref Rng & Units 06/17/2017  Glucose 65 - 99 mg/dL 646(O)  BUN 6 - 20 mg/dL 11  Creatinine 0.32 - 1.22 mg/dL 4.82  Sodium 500 - 370 mmol/L 138  Potassium 3.5 - 5.1 mmol/L 3.5  Chloride 101 - 111 mmol/L 102  CO2 22 - 32 mmol/L 29  Calcium 8.9 - 10.3 mg/dL 4.8(G)    No results found for: INR, PROTIME  Imaging: No results found.   A/P:  MORBID OBESITY (E66.01) Story: She remains a good candidate for sleeve gastrectomy. We have previously discussed the surgery including technical aspects, the risks of bleeding, infection, pain, scarring, injury to intra-abdominal structures, staple line leak or abscess, chronic abdominal  pain or nausea, new onset or worsened GERD, DVT/PE, pneumonia, heart attack, stroke, death, failure to reach weight loss goals and weight regain, hernia. Discussed the typical peri-, and postoperative course. Discussed the importance of lifelong behavioral changes to combat the chronic and relapsing disease which is obesity. Questions were welcomed and answered to her satisfaction. Plan to proceed as scheduled 04/17/21 HYPERTENSION (I10) OSTEOARTHRITIS (M19.90) MIGRAINE (G43.909     Phylliss Blakes, MD [facility], [place]  See AMION to contact appropriate on-call provider

## 2021-03-20 NOTE — H&P (Signed)
Surgical Evaluation  Chief Complaint: morbid obesity  HPI:  She returns for follow-up regarding surgical treatment of severe obesity.  Reports no changes in her health since our initial meeting 3 months ago.  She has completed the bariatric pathway with no barriers identified. Has several insightful questions.  She has lost 15lb since our first visit!   UGI/CXR: 01/20/21 negative, no mention of hh Dietician: approved Counsellor) Psychologist: approved (01/19/21 [employer], Ronette Deter LCSW LCAS CSI) Labs: H pylori +- treated. Folate/ thiamine was borderline, HgA1C 8.1  Initial visit 12/28/20: This is a very pleasant 50 year old woman with chief complaint of morbid obesity  She has trouble with this for her entire life.  She has tried numerous diets, exercise plans, as well as medications each with limited and transient success.  Her highest adult weight is 425 pounds and she is currently 415 pounds. Most successful attempt was with saxenda which allowed her to lose 50 pounds, much of which she regained when she stopped taking the medication.  She had been doing water aerobics and Jazzercise classes but had to stop these when the pandemic began.  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  She is interested in losing weight in order to improve her health, improve her mobility, and improve her quality of life as well as to avoid developing further complications of obesity.  She has recently joined a facebook support group and has found that extremely enlightening and encouraging.  She has a history of migraine headaches, hypertension, osteoarthritis.  Previous abdominal surgery of C-section.  She lives at home with her husband.  They have 2 grown children, her daughter lives in [place] and son is 61 years old.  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  Denies tobacco, alcohol or drug use.  415lb/ bmi 69  No Known Allergies  Past  Medical History:  Diagnosis Date   Hypertension    Obesity    Osteoarthritis     No past surgical history on file.  No family history on file.  Social History   Socioeconomic History   Marital status: Married    Spouse name: Not on file   Number of children: 1   Years of education: 16   Highest education level: Not on file  Occupational History   Not on file  Tobacco Use   Smoking status: Never   Smokeless tobacco: Never  Vaping Use   Vaping Use: Not on file  Substance and Sexual Activity   Alcohol use: Yes    Comment: social   Drug use: No   Sexual activity: Not on file  Other Topics Concern   Not on file  Social History Narrative   Right handed   Drinks caffeine   Two story home   Social Determinants of Health   Financial Resource Strain: Not on file  Food Insecurity: Not on file  Transportation Needs: Not on file  Physical Activity: Not on file  Stress: Not on file  Social Connections: Not on file    Current Outpatient Medications on File Prior to Visit  Medication Sig Dispense Refill   albuterol (VENTOLIN HFA) 108 (90 Base) MCG/ACT inhaler albuterol sulfate HFA 90 mcg/actuation aerosol inhaler  INHALE 2 PUFFS INTO THE LUNGS EVERY 6 HOURS AS NEEDED FOR WHEEZING     amLODipine (NORVASC) 10 MG tablet amlodipine 10 mg tablet  TAKE 1 TABLET BY MOUTH EVERY DAY     aspirin-acetaminophen-caffeine (EXCEDRIN  MIGRAINE) 250-250-65 MG tablet Take by mouth every 6 (six) hours as needed for headache.     carvedilol (COREG) 25 MG tablet Take 25 mg by mouth 2 (two) times daily with a meal.     DULoxetine (CYMBALTA) 30 MG capsule Take 30 mg by mouth. 1 tab tid     furosemide (LASIX) 20 MG tablet Take 20 mg by mouth.     gabapentin (NEURONTIN) 800 MG tablet Take 800 mg by mouth 3 (three) times daily.     losartan (COZAAR) 100 MG tablet Take 1 tablet (100 mg total) daily by mouth. 30 tablet 0   Naproxen-Esomeprazole 500-20 MG TBEC Take one tablet every 12 hours with food  as needed. (Patient not taking: Reported on 12/15/2020) 60 tablet 0   ondansetron (ZOFRAN ODT) 4 MG disintegrating tablet Take 1 tablet (4 mg total) by mouth every 8 (eight) hours as needed for nausea or vomiting. 20 tablet 5   pioglitazone (ACTOS) 30 MG tablet Take 30 mg by mouth daily.     rizatriptan (MAXALT-MLT) 10 MG disintegrating tablet Take 1 tablet (10 mg total) by mouth as needed for migraine (May repeat in 2 hours.  Max 2 tablets in 24 hours). May repeat in 2 hours if needed 10 tablet 5   simvastatin (ZOCOR) 10 MG tablet Take 10 mg by mouth daily.     topiramate (TOPAMAX) 50 MG tablet Take 1 tablet (50 mg total) by mouth at bedtime. Take 1/2 tablet at bedtime for one week, then increase to 1 tablet at bedtime 30 tablet 5   Triamterene-HCTZ (MAXZIDE-25 PO) Take by mouth.     No current facility-administered medications on file prior to visit.    Review of Systems: a complete, 10pt review of systems was completed with pertinent positives and negatives as documented in the HPI  Physical Exam: There were no vitals filed for this visit. Gen: A&Ox3, no distress  Unlabored respirations   CBC Latest Ref Rng & Units 06/17/2017  WBC 4.0 - 10.5 K/uL 10.2  Hemoglobin 12.0 - 15.0 g/dL 13.0  Hematocrit 36.0 - 46.0 % 40.3  Platelets 150 - 400 K/uL 276    CMP Latest Ref Rng & Units 06/17/2017  Glucose 65 - 99 mg/dL 180(H)  BUN 6 - 20 mg/dL 11  Creatinine 0.44 - 1.00 mg/dL 0.94  Sodium 135 - 145 mmol/L 138  Potassium 3.5 - 5.1 mmol/L 3.5  Chloride 101 - 111 mmol/L 102  CO2 22 - 32 mmol/L 29  Calcium 8.9 - 10.3 mg/dL 8.7(L)    No results found for: INR, PROTIME  Imaging: No results found.   A/P:  MORBID OBESITY (E66.01) Story: She remains a good candidate for sleeve gastrectomy. We have previously discussed the surgery including technical aspects, the risks of bleeding, infection, pain, scarring, injury to intra-abdominal structures, staple line leak or abscess, chronic abdominal  pain or nausea, new onset or worsened GERD, DVT/PE, pneumonia, heart attack, stroke, death, failure to reach weight loss goals and weight regain, hernia. Discussed the typical peri-, and postoperative course. Discussed the importance of lifelong behavioral changes to combat the chronic and relapsing disease which is obesity. Questions were welcomed and answered to her satisfaction. Plan to proceed as scheduled 04/17/21 HYPERTENSION (I10) OSTEOARTHRITIS (M19.90) MIGRAINE (G43.909     Shamere Campas, MD [facility]  See AMION to contact appropriate on-call provider   

## 2021-03-21 NOTE — Progress Notes (Signed)
Pre-Operative Nutrition Class:    Patient was seen on 03/20/2021 for Pre-Operative Bariatric Surgery Education at the [facility].    Surgery date:  Surgery type: sleeve Start weight at [facility]: 408 pounds Weight today: 405.5 pounds  Samples given per MNT protocol. Patient educated on appropriate usage: B[employer]ric [employer] Multivitamin Lot # [id number] Exp: 08/23   [employer] Calcium  Lot # [id number] Exp: 03/23   B[employer] protein powder Lot # [id number] Exp: 10/23  The following the learning objectives were met by the patient during this course: Identify Pre-Op Dietary Goals and will begin 2 weeks pre-operatively Identify appropriate sources of fluids and proteins  State protein recommendations and appropriate sources pre and post-operatively Identify Post-Operative Dietary Goals and will follow for 2 weeks post-operatively Identify appropriate multivitamin and calcium sources Describe the need for physical activity post-operatively and will follow MD recommendations State when to call healthcare provider regarding medication questions or post-operative complications When having a diagnosis of diabetes understanding hypoglycemia symptoms and the inclusion of 1 complex carbohydrate per meal  Handouts given during class include: Pre-Op Bariatric Surgery Diet Handout Protein Shake Handout Post-Op Bariatric Surgery Nutrition Handout BELT Program Information Flyer Support Group Information Flyer [facility] Bariatric Supplements Price List  Follow-Up Plan: Patient will follow-up at [facility] 2 weeks post operatively for diet advancement per MD.

## 2021-03-24 ENCOUNTER — Encounter (HOSPITAL_COMMUNITY): Payer: Self-pay

## 2021-03-24 NOTE — Patient Instructions (Addendum)
DUE TO COVID-19 ONLY ONE VISITOR IS ALLOWED TO COME WITH YOU AND STAY IN THE WAITING ROOM ONLY DURING PRE OP AND PROCEDURE.   **NO VISITORS ARE ALLOWED IN THE SHORT STAY AREA OR RECOVERY ROOM!!**  IF YOU WILL BE ADMITTED INTO THE HOSPITAL YOU ARE ALLOWED ONLY TWO SUPPORT PEOPLE DURING VISITATION HOURS ONLY (10AM -8PM)   The support person(s) may change daily. The support person(s) must pass our screening, gel in and out, and wear a mask at all times, including in the patient's room. Patients must also wear a mask when staff or their support person are in the room.  No visitors under the age of 61. Any visitor under the age of 53 must be accompanied by an adult.   COVID SWAB TESTING MUST BE COMPLETED ON:  Thursday, 04-13-21  Between the hours of 8 and 3  **MUST PRESENT COMPLETED FORM AT TESTING SITE**    [address] [place] (backside of the building)  You are not required to quarantine, however you are required to wear a well-fitted mask when you are out and around people not in your household.  Hand Hygiene often Do NOT share personal items Notify your provider if you are in close contact with someone who has COVID or you develop fever 100.4 or greater, new onset of sneezing, cough, sore throat, shortness of breath or body aches.         Your procedure is scheduled on:  Monday, 04-17-21   Report to [facility] Main  Entrance    Report to admitting at 8:15 AM   Call this number if you have problems the morning of surgery [phone]   Do not eat food :After 6:00 PM the night before surgery.   May have liquids until 7:30 AM day of surgery  CLEAR LIQUID DIET  Foods Allowed                                                                     Foods Excluded  Water, Black Coffee (no milk/no creamer) and tea, regular and decaf                              liquids that you cannot  Plain Jell-O in any flavor  (No red)                         see through such  as: Fruit ices (not with fruit pulp)                                 milk, soups, orange juice  Iced Popsicles (No red)                                    All solid food                             Apple juices Sports drinks like Gatorade (No red) Lightly seasoned clear broth or consume(fat free)  Sugar      Oral Hygiene is also important to reduce your risk of infection.                                    Remember - BRUSH YOUR TEETH THE MORNING OF SURGERY WITH YOUR REGULAR TOOTHPASTE   Do NOT smoke after Midnight  Take these medicines the morning of surgery with A SIP OF WATER:  Topiramaate, Gabapentin, Duloxetine, Carvedilol, Amlodipine, Inhalers (bring inhalers with you day of surgery)  How to Manage Your Diabetes Before and After Surgery  Why is it important to control my blood sugar before and after surgery? Improving blood sugar levels before and after surgery helps healing and can limit problems. A way of improving blood sugar control is eating a healthy diet by:  Eating less sugar and carbohydrates  Increasing activity/exercise  Talking with your doctor about reaching your blood sugar goals High blood sugars (greater than 180 mg/dL) can raise your risk of infections and slow your recovery, so you will need to focus on controlling your diabetes during the weeks before surgery. Make sure that the doctor who takes care of your diabetes knows about your planned surgery including the date and location.  How do I manage my blood sugar before surgery? Check your blood sugar at least 4 times a day, starting 2 days before surgery, to make sure that the level is not too high or low. Check your blood sugar the morning of your surgery when you wake up and every 2 hours until you get to the Short Stay unit. If your blood sugar is less than 70 mg/dL, you will need to treat for low blood sugar: Do not take insulin. Treat a low blood sugar (less than 70 mg/dL) with  cup of clear juice  (cranberry or apple), 4 glucose tablets, OR glucose gel. Recheck blood sugar in 15 minutes after treatment (to make sure it is greater than 70 mg/dL). If your blood sugar is not greater than 70 mg/dL on recheck, call [phone] for further instructions. Report your blood sugar to the short stay nurse when you get to Short Stay.  If you are admitted to the hospital after surgery: Your blood sugar will be checked by the staff and you will probably be given insulin after surgery (instead of oral diabetes medicines) to make sure you have good blood sugar levels. The goal for blood sugar control after surgery is 80-180 mg/dL.   WHAT DO I DO ABOUT MY DIABETES MEDICATION?  Do not take oral diabetes medicines (pills) the morning of surgery.  THE DAY BEFORE SURGERY:  Take Actos and Metformin as prescribed.        THE MORNING OF SURGERY:  Do not take Actos or Metformin.   Reviewed and Endorsed by [facility] Patient Education Committee, August 2015                               You may not have any metal on your body including hair pins, jewelry, and body piercing             Do not wear make-up, lotions, powders, perfumes, or deodorant  Do not wear nail polish including gel and S&S, artificial/acrylic nails, or any other type of covering on natural nails including finger and toenails. If you have artificial nails, gel coating, etc. that  needs to be removed by a nail salon please have this removed prior to surgery or surgery may need to be canceled/ delayed if the surgeon/ anesthesia feels like they are unable to be safely monitored.   Do not shave  48 hours prior to surgery.    Do not bring valuables to the hospital. [facility] IS NOT  RESPONSIBLE   FOR VALUABLES.   Contacts, dentures or bridgework may not be worn into surgery.   Bring small overnight bag day of surgery.   Please read over the following fact sheets you were given: IF YOU HAVE QUESTIONS ABOUT YOUR PRE OP INSTRUCTIONS  PLEASE CALL [phone] [facility] - Preparing for Surgery Before surgery, you can play an important role.  Because skin is not sterile, your skin needs to be as free of germs as possible.  You can reduce the number of germs on your skin by washing with CHG (chlorahexidine gluconate) soap before surgery.  CHG is an antiseptic cleaner which kills germs and bonds with the skin to continue killing germs even after washing. Please DO NOT use if you have an allergy to CHG or antibacterial soaps.  If your skin becomes reddened/irritated stop using the CHG and inform your nurse when you arrive at Short Stay. Do not shave (including legs and underarms) for at least 48 hours prior to the first CHG shower.  You may shave your face/neck. Please follow these instructions carefully:  1.  Shower with CHG Soap the night before surgery and the  morning of Surgery.  2.  If you choose to wash your hair, wash your hair first as usual with your  normal  shampoo.  3.  After you shampoo, rinse your hair and body thoroughly to remove the  shampoo.                           4.  Use CHG as you would any other liquid soap.  You can apply chg directly  to the skin and wash                       Gently with a scrungie or clean washcloth.  5.  Apply the CHG Soap to your body ONLY FROM THE NECK DOWN.   Do not use on face/ open                           Wound or open sores. Avoid contact with eyes, ears mouth and genitals (private parts).                       Wash face,  Genitals (private parts) with your normal soap.             6.  Wash thoroughly, paying special attention to the area where your surgery  will be performed.  7.  Thoroughly rinse your body with warm water from the neck down.  8.  DO NOT shower/wash with your normal soap after using and rinsing off  the CHG Soap.                9.  Pat yourself dry with a clean towel.            10.  Wear clean pajamas.  11.  Place clean sheets on  your bed the night of your first shower and do not  sleep with pets. Day of Surgery : Do not apply any lotions/deodorants the morning of surgery.  Please wear clean clothes to the hospital/surgery center.  FAILURE TO FOLLOW THESE INSTRUCTIONS MAY RESULT IN THE CANCELLATION OF YOUR SURGERY PATIENT SIGNATURE_________________________________  NURSE SIGNATURE__________________________________   Incentive Spirometer  An incentive spirometer is a tool that can help keep your lungs clear and active. This tool measures how well you are filling your lungs with each breath. Taking long deep breaths may help reverse or decrease the chance of developing breathing (pulmonary) problems (especially infection) following: A long period of time when you are unable to move or be active. BEFORE THE PROCEDURE  If the spirometer includes an indicator to show your best effort, your nurse or respiratory therapist will set it to a desired goal. If possible, sit up straight or lean slightly forward. Try not to slouch. Hold the incentive spirometer in an upright position. INSTRUCTIONS FOR USE  Sit on the edge of your bed if possible, or sit up as far as you can in bed or on a chair. Hold the incentive spirometer in an upright position. Breathe out normally. Place the mouthpiece in your mouth and seal your lips tightly around it. Breathe in slowly and as deeply as possible, raising the piston or the ball toward the top of the column. Hold your breath for 3-5 seconds or for as long as possible. Allow the piston or ball to fall to the bottom of the column. Remove the mouthpiece from your mouth and breathe out normally. Rest for a few seconds and repeat Steps 1 through 7 at least 10 times every 1-2 hours when you are awake. Take your time and take a few normal breaths between deep breaths. The spirometer may include an indicator to show your best effort. Use the indicator as a goal to work toward during each  repetition. After each set of 10 deep breaths, practice coughing to be sure your lungs are clear. If you have an incision (the cut made at the time of surgery), support your incision when coughing by placing a pillow or rolled up towels firmly against it. Once you are able to get out of bed, walk around indoors and cough well. You may stop using the incentive spirometer when instructed by your caregiver.  RISKS AND COMPLICATIONS Take your time so you do not get dizzy or light-headed. If you are in pain, you may need to take or ask for pain medication before doing incentive spirometry. It is harder to take a deep breath if you are having pain. AFTER USE Rest and breathe slowly and easily. It can be helpful to keep track of a log of your progress. Your caregiver can provide you with a simple table to help with this. If you are using the spirometer at home, follow these instructions: SEEK MEDICAL CARE IF:  You are having difficultly using the spirometer. You have trouble using the spirometer as often as instructed. Your pain medication is not giving enough relief while using the spirometer. You develop fever of 100.5 F (38.1 C) or higher. SEEK IMMEDIATE MEDICAL CARE IF:  You cough up bloody sputum that had not been present before. You develop fever of 102 F (38.9 C) or greater. You develop worsening pain at or near the incision site. MAKE SURE YOU:  Understand these instructions. Will watch your condition. Will get help  right away if you are not doing well or get worse. Document Released: 11/26/2006 Document Revised: 10/08/2011 Document Reviewed: 01/27/2007 [facility] Patient Information 2014 [employer], [place].   ________________________________________________________________________  ________________________________________________________________________

## 2021-03-27 ENCOUNTER — Other Ambulatory Visit (HOSPITAL_COMMUNITY): Payer: Self-pay

## 2021-03-28 ENCOUNTER — Encounter (HOSPITAL_COMMUNITY)
Admission: RE | Admit: 2021-03-28 | Discharge: 2021-03-28 | Disposition: A | Discharge status: Home / Self Care | Payer: 59 | Source: Ambulatory Visit | Attending: Surgery | Admitting: Surgery

## 2021-03-28 ENCOUNTER — Encounter (HOSPITAL_COMMUNITY): Payer: Self-pay

## 2021-03-28 ENCOUNTER — Other Ambulatory Visit: Payer: Self-pay

## 2021-03-28 DIAGNOSIS — Z01812 Encounter for preprocedural laboratory examination: Secondary | ICD-10-CM | POA: Insufficient documentation

## 2021-03-28 HISTORY — DX: Unspecified asthma, uncomplicated: J45.909

## 2021-03-28 HISTORY — DX: Headache, unspecified: R51.9

## 2021-03-28 HISTORY — DX: Type 2 diabetes mellitus without complications: E11.9

## 2021-03-28 LAB — CBC WITH DIFFERENTIAL/PLATELET
Abs Immature Granulocytes: 0.03 10*3/uL (ref 0.00–0.07)
Basophils Absolute: 0.1 10*3/uL (ref 0.0–0.1)
Basophils Relative: 1 %
Eosinophils Absolute: 0.4 10*3/uL (ref 0.0–0.5)
Eosinophils Relative: 4 %
HCT: 39.5 % (ref 36.0–46.0)
Hemoglobin: 12.4 g/dL (ref 12.0–15.0)
Immature Granulocytes: 0 %
Lymphocytes Relative: 29 %
Lymphs Abs: 3 10*3/uL (ref 0.7–4.0)
MCH: 28.1 pg (ref 26.0–34.0)
MCHC: 31.4 g/dL (ref 30.0–36.0)
MCV: 89.6 fL (ref 80.0–100.0)
Monocytes Absolute: 0.7 10*3/uL (ref 0.1–1.0)
Monocytes Relative: 6 %
Neutro Abs: 6.1 10*3/uL (ref 1.7–7.7)
Neutrophils Relative %: 60 %
Platelets: 345 10*3/uL (ref 150–400)
RBC: 4.41 MIL/uL (ref 3.87–5.11)
RDW: 15 % (ref 11.5–15.5)
WBC: 10.2 10*3/uL (ref 4.0–10.5)
nRBC: 0 % (ref 0.0–0.2)

## 2021-03-28 LAB — COMPREHENSIVE METABOLIC PANEL
ALT: 12 U/L (ref 0–44)
AST: 16 U/L (ref 15–41)
Albumin: 3.7 g/dL (ref 3.5–5.0)
Alkaline Phosphatase: 86 U/L (ref 38–126)
Anion gap: 7 (ref 5–15)
BUN: 18 mg/dL (ref 6–20)
CO2: 28 mmol/L (ref 22–32)
Calcium: 9.3 mg/dL (ref 8.9–10.3)
Chloride: 107 mmol/L (ref 98–111)
Creatinine, Ser: 1.02 mg/dL — ABNORMAL HIGH (ref 0.44–1.00)
GFR, Estimated: >60 mL/min (ref >60)
Glucose, Bld: 101 mg/dL — ABNORMAL HIGH (ref 70–99)
Potassium: 3.8 mmol/L (ref 3.5–5.1)
Sodium: 142 mmol/L (ref 135–145)
Total Bilirubin: 0.3 mg/dL (ref 0.3–1.2)
Total Protein: 7.7 g/dL (ref 6.5–8.1)

## 2021-03-28 LAB — GLUCOSE, CAPILLARY: Glucose-Capillary: 92 mg/dL (ref 70–99)

## 2021-03-28 NOTE — Progress Notes (Signed)
[place] requested with portable equipment for surgery 04-17-21

## 2021-03-28 NOTE — Progress Notes (Addendum)
COVID swab appointment:  04-13-21  COVID Vaccine Completed:  Yes x3 Date COVID Vaccine completed: Has received booster: Yes x1 COVID vaccine manufacturer: Pfizer     Date of COVID positive in last 90 days:  No  PCP - Bernadette Hoit, MD Cardiologist - N/A  Chest x-ray - 01-21-21 Epic EKG - 01-20-21 Epic Stress Test - N/A ECHO - N/A Cardiac Cath - N/A Pacemaker/ICD device last checked: Spinal Cord Stimulator:  Sleep Study - N/A CPAP -   Fasting Blood Sugar - Does not check  Checks Blood Sugar _____ times a day  Blood Thinner Instructions: N/A Aspirin Instructions: Last Dose:  Activity level:  Can go up a flight of stairs and perform activities of daily living without stopping and without symptoms of chest pain or shortness of breath.  Patient states that she does get winded if she overexerts herself.      Anesthesia review: Evaluated for palpitations by PCP with EKG.  HTN, DM,  BMI 67.60.  BP 197/91, recheck 186/89 at PAT, patient denies chest pain, SOB.  Patient monitors at home and states that is usually runs in the 160/80 range.  She will continue to monitor until surgery and notify PCP if it remains elevated.  Patient denies shortness of breath, fever, cough and chest pain at PAT appointment   Patient verbalized understanding of instructions that were given to them at the PAT appointment. Patient was also instructed that they will need to review over the PAT instructions again at home before surgery.

## 2021-03-29 LAB — HEMOGLOBIN A1C
Hgb A1c MFr Bld: 6.5 % — ABNORMAL HIGH (ref 4.8–5.6)
Mean Plasma Glucose: 140 mg/dL

## 2021-04-13 ENCOUNTER — Other Ambulatory Visit: Payer: Self-pay | Admitting: Surgery

## 2021-04-13 LAB — SARS CORONAVIRUS 2 (TAT 6-24 HRS): SARS Coronavirus 2: NEGATIVE

## 2021-04-16 NOTE — Anesthesia Preprocedure Evaluation (Addendum)
Anesthesia Evaluation  Patient identified by MRN, date of birth, ID band Patient awake    Reviewed: Allergy & Precautions, NPO status , Patient's Chart, lab work & pertinent test results, reviewed documented beta blocker date and time   Airway Mallampati: III  TM Distance: >3 FB Neck ROM: Full    Dental no notable dental hx. (+) Teeth Intact, Dental Advisory Given, Missing,    Pulmonary asthma (hasnt used rescue inhaler in months) ,    Pulmonary exam normal breath sounds clear to auscultation       Cardiovascular hypertension (176/89 in preop, per pt normally lower), Pt. on medications and Pt. on home beta blockers Normal cardiovascular exam Rhythm:Regular Rate:Normal     Neuro/Psych  Headaches, negative psych ROS   GI/Hepatic negative GI ROS, Neg liver ROS,   Endo/Other  diabetes, Well Controlled, Type 2, Oral Hypoglycemic AgentsMorbid obesitySuper morbid obesity BMI 68 a1c 6.5  Renal/GU negative Renal ROS  negative genitourinary   Musculoskeletal  (+) Arthritis , Osteoarthritis,    Abdominal (+) + obese,   Peds  Hematology negative hematology ROS (+)   Anesthesia Other Findings   Reproductive/Obstetrics negative OB ROS                           Anesthesia Physical Anesthesia Plan  ASA: 4  Anesthesia Plan: General   Post-op Pain Management:    Induction: Intravenous  PONV Risk Score and Plan: 4 or greater and Ondansetron, Aprepitant, Dexamethasone, Midazolam, Scopolamine patch - Pre-op and Treatment may vary due to age or medical condition  Airway Management Planned: Oral ETT  Additional Equipment: None  Intra-op Plan:   Post-operative Plan: Extubation in OR  Informed Consent: I have reviewed the patients History and Physical, chart, labs and discussed the procedure including the risks, benefits and alternatives for the proposed anesthesia with the patient or authorized  representative who has indicated his/her understanding and acceptance.     Dental advisory given  Plan Discussed with: CRNA  Anesthesia Plan Comments:        Anesthesia Quick Evaluation

## 2021-04-17 ENCOUNTER — Inpatient Hospital Stay (HOSPITAL_COMMUNITY)
Admission: RE | Admit: 2021-04-17 | Discharge: 2021-04-18 | DRG: 621 | Disposition: A | Discharge status: Home / Self Care | Payer: 59 | Source: Ambulatory Visit | Attending: Surgery | Admitting: Surgery

## 2021-04-17 ENCOUNTER — Other Ambulatory Visit: Payer: Self-pay

## 2021-04-17 ENCOUNTER — Inpatient Hospital Stay (HOSPITAL_COMMUNITY): Payer: 59 | Admitting: Physician Assistant

## 2021-04-17 ENCOUNTER — Encounter (HOSPITAL_COMMUNITY): Payer: Self-pay | Admitting: Surgery

## 2021-04-17 ENCOUNTER — Inpatient Hospital Stay (HOSPITAL_COMMUNITY): Payer: 59 | Admitting: Certified Registered Nurse Anesthetist

## 2021-04-17 ENCOUNTER — Encounter (HOSPITAL_COMMUNITY)
Admission: RE | Disposition: A | Discharge status: Home / Self Care | Payer: Self-pay | Source: Ambulatory Visit | Attending: Surgery

## 2021-04-17 DIAGNOSIS — Z7982 Long term (current) use of aspirin: Secondary | ICD-10-CM

## 2021-04-17 DIAGNOSIS — Z6841 Body Mass Index (BMI) 40.0 and over, adult: Secondary | ICD-10-CM | POA: Diagnosis not present

## 2021-04-17 DIAGNOSIS — Z79899 Other long term (current) drug therapy: Secondary | ICD-10-CM

## 2021-04-17 DIAGNOSIS — M1990 Unspecified osteoarthritis, unspecified site: Secondary | ICD-10-CM | POA: Diagnosis present

## 2021-04-17 DIAGNOSIS — I10 Essential (primary) hypertension: Secondary | ICD-10-CM | POA: Diagnosis present

## 2021-04-17 HISTORY — PX: LAPAROSCOPIC GASTRIC SLEEVE RESECTION: SHX5895

## 2021-04-17 HISTORY — PX: UPPER GI ENDOSCOPY: SHX6162

## 2021-04-17 LAB — TYPE AND SCREEN
ABO/RH(D): AB POS
Antibody Screen: NEGATIVE

## 2021-04-17 LAB — GLUCOSE, CAPILLARY
Glucose-Capillary: 124 mg/dL — ABNORMAL HIGH (ref 70–99)
Glucose-Capillary: 135 mg/dL — ABNORMAL HIGH (ref 70–99)
Glucose-Capillary: 145 mg/dL — ABNORMAL HIGH (ref 70–99)
Glucose-Capillary: 153 mg/dL — ABNORMAL HIGH (ref 70–99)
Glucose-Capillary: 96 mg/dL (ref 70–99)

## 2021-04-17 LAB — HCG, SERUM, QUALITATIVE: Preg, Serum: NEGATIVE

## 2021-04-17 LAB — ABO/RH: ABO/RH(D): AB POS

## 2021-04-17 SURGERY — GASTRECTOMY, SLEEVE, LAPAROSCOPIC
Anesthesia: General | Site: Esophagus

## 2021-04-17 MED ORDER — SUGAMMADEX SODIUM 200 MG/2ML IV SOLN
INTRAVENOUS | Status: DC | PRN
  Administered 2021-04-17: 350 mg via INTRAVENOUS

## 2021-04-17 MED ORDER — TRAMADOL HCL 50 MG PO TABS
50.0000 mg | ORAL_TABLET | Freq: Four times a day (QID) | ORAL | Status: DC | PRN
Start: 2021-04-17 — End: 2021-04-18

## 2021-04-17 MED ORDER — ACETAMINOPHEN 160 MG/5ML PO SOLN: 1000.0000 mg | Freq: Three times a day (TID) | ORAL | Status: DC

## 2021-04-17 MED ORDER — MIDAZOLAM HCL 2 MG/2ML IJ SOLN
INTRAMUSCULAR | Status: DC | PRN
  Administered 2021-04-17: 2 mg via INTRAVENOUS

## 2021-04-17 MED ORDER — RIZATRIPTAN BENZOATE 10 MG PO TBDP: 10.0000 mg | ORAL_TABLET | ORAL | Status: DC | PRN

## 2021-04-17 MED ORDER — BUPIVACAINE LIPOSOME 1.3 % IJ SUSP
INTRAMUSCULAR | Status: AC
  Filled 2021-04-17: qty 20

## 2021-04-17 MED ORDER — ACETAMINOPHEN 500 MG PO TABS
1000.0000 mg | ORAL_TABLET | Freq: Three times a day (TID) | ORAL | Status: DC
  Administered 2021-04-17 – 2021-04-18 (×2): 1000 mg via ORAL
  Filled 2021-04-17 (×2): qty 2

## 2021-04-17 MED ORDER — BUPIVACAINE LIPOSOME 1.3 % IJ SUSP
INTRAMUSCULAR | Status: DC | PRN
  Administered 2021-04-17: 20 mL

## 2021-04-17 MED ORDER — EPHEDRINE SULFATE-NACL 50-0.9 MG/10ML-% IV SOSY
PREFILLED_SYRINGE | INTRAVENOUS | Status: DC | PRN
  Administered 2021-04-17: 10 mg via INTRAVENOUS

## 2021-04-17 MED ORDER — PROMETHAZINE HCL 25 MG/ML IJ SOLN: 6.2500 mg | INTRAMUSCULAR | Status: DC | PRN

## 2021-04-17 MED ORDER — FENTANYL CITRATE (PF) 250 MCG/5ML IJ SOLN
INTRAMUSCULAR | Status: AC
  Filled 2021-04-17: qty 5

## 2021-04-17 MED ORDER — GABAPENTIN 300 MG PO CAPS
300.0000 mg | ORAL_CAPSULE | ORAL | Status: DC
  Filled 2021-04-17: qty 1

## 2021-04-17 MED ORDER — METOPROLOL TARTRATE 5 MG/5ML IV SOLN: 5.0000 mg | Freq: Four times a day (QID) | INTRAVENOUS | Status: DC | PRN

## 2021-04-17 MED ORDER — SODIUM CHLORIDE 0.9 % IV SOLN
2.0000 g | INTRAVENOUS | Status: AC
  Administered 2021-04-17: 2 g via INTRAVENOUS
  Filled 2021-04-17: qty 2

## 2021-04-17 MED ORDER — OXYCODONE HCL 5 MG PO TABS
5.0000 mg | ORAL_TABLET | Freq: Once | ORAL | Status: DC | PRN
Start: 2021-04-17 — End: 2021-04-17

## 2021-04-17 MED ORDER — ONDANSETRON HCL 4 MG/2ML IJ SOLN
INTRAMUSCULAR | Status: AC
  Filled 2021-04-17: qty 2

## 2021-04-17 MED ORDER — OXYCODONE HCL 5 MG/5ML PO SOLN
5.0000 mg | Freq: Four times a day (QID) | ORAL | Status: DC | PRN
  Administered 2021-04-17 – 2021-04-18 (×2): 5 mg via ORAL
  Filled 2021-04-17 (×2): qty 5

## 2021-04-17 MED ORDER — FENTANYL CITRATE (PF) 100 MCG/2ML IJ SOLN
INTRAMUSCULAR | Status: DC | PRN
  Administered 2021-04-17 (×2): 50 ug via INTRAVENOUS

## 2021-04-17 MED ORDER — METOCLOPRAMIDE HCL 5 MG/ML IJ SOLN
10.0000 mg | Freq: Four times a day (QID) | INTRAMUSCULAR | Status: DC
  Administered 2021-04-17 – 2021-04-18 (×4): 10 mg via INTRAVENOUS
  Filled 2021-04-17 (×4): qty 2

## 2021-04-17 MED ORDER — 0.9 % SODIUM CHLORIDE (POUR BTL) OPTIME
TOPICAL | Status: DC | PRN
  Administered 2021-04-17: 1000 mL

## 2021-04-17 MED ORDER — PROPOFOL 10 MG/ML IV BOLUS
INTRAVENOUS | Status: DC | PRN
  Administered 2021-04-17: 200 mg via INTRAVENOUS

## 2021-04-17 MED ORDER — AMLODIPINE BESYLATE 10 MG PO TABS
10.0000 mg | ORAL_TABLET | Freq: Every day | ORAL | Status: DC
  Administered 2021-04-18: 10 mg via ORAL
  Filled 2021-04-17: qty 1

## 2021-04-17 MED ORDER — SCOPOLAMINE 1 MG/3DAYS TD PT72
1.0000 | MEDICATED_PATCH | TRANSDERMAL | Status: DC
  Administered 2021-04-17: 1.5 mg via TRANSDERMAL
  Filled 2021-04-17: qty 1

## 2021-04-17 MED ORDER — SUCCINYLCHOLINE CHLORIDE 200 MG/10ML IV SOSY
PREFILLED_SYRINGE | INTRAVENOUS | Status: DC | PRN
  Administered 2021-04-17: 200 mg via INTRAVENOUS

## 2021-04-17 MED ORDER — APREPITANT 40 MG PO CAPS
40.0000 mg | ORAL_CAPSULE | ORAL | Status: AC
  Administered 2021-04-17: 40 mg via ORAL
  Filled 2021-04-17: qty 1

## 2021-04-17 MED ORDER — AMISULPRIDE (ANTIEMETIC) 5 MG/2ML IV SOLN: 10.0000 mg | Freq: Once | INTRAVENOUS | Status: DC | PRN

## 2021-04-17 MED ORDER — TOPIRAMATE 25 MG PO TABS: 50.0000 mg | ORAL_TABLET | Freq: Every day | ORAL | Status: DC

## 2021-04-17 MED ORDER — DOCUSATE SODIUM 100 MG PO CAPS
100.0000 mg | ORAL_CAPSULE | Freq: Two times a day (BID) | ORAL | Status: DC
  Administered 2021-04-17 – 2021-04-18 (×2): 100 mg via ORAL
  Filled 2021-04-17 (×2): qty 1

## 2021-04-17 MED ORDER — SIMETHICONE 80 MG PO CHEW: 80.0000 mg | CHEWABLE_TABLET | Freq: Four times a day (QID) | ORAL | Status: DC | PRN

## 2021-04-17 MED ORDER — MIDAZOLAM HCL 2 MG/2ML IJ SOLN
INTRAMUSCULAR | Status: AC
  Filled 2021-04-17: qty 2

## 2021-04-17 MED ORDER — CHLORHEXIDINE GLUCONATE 0.12 % MT SOLN
15.0000 mL | Freq: Once | OROMUCOSAL | Status: AC
  Administered 2021-04-17: 15 mL via OROMUCOSAL

## 2021-04-17 MED ORDER — HYDROMORPHONE HCL 1 MG/ML IJ SOLN
0.5000 mg | INTRAMUSCULAR | Status: DC | PRN
  Administered 2021-04-17: 0.5 mg via SUBCUTANEOUS
  Filled 2021-04-17: qty 0.5

## 2021-04-17 MED ORDER — MEPERIDINE HCL 50 MG/ML IJ SOLN: 6.2500 mg | INTRAMUSCULAR | Status: DC | PRN

## 2021-04-17 MED ORDER — OXYCODONE HCL 5 MG/5ML PO SOLN: 5.0000 mg | Freq: Once | ORAL | Status: DC | PRN

## 2021-04-17 MED ORDER — LIDOCAINE HCL (PF) 2 % IJ SOLN
INTRAMUSCULAR | Status: AC
  Filled 2021-04-17: qty 5

## 2021-04-17 MED ORDER — KETOROLAC TROMETHAMINE 30 MG/ML IJ SOLN
INTRAMUSCULAR | Status: AC
  Filled 2021-04-17: qty 1

## 2021-04-17 MED ORDER — ONDANSETRON HCL 4 MG/2ML IJ SOLN: 4.0000 mg | INTRAMUSCULAR | Status: DC | PRN

## 2021-04-17 MED ORDER — BUPIVACAINE-EPINEPHRINE 0.25% -1:200000 IJ SOLN
INTRAMUSCULAR | Status: DC | PRN
  Administered 2021-04-17: 30 mL

## 2021-04-17 MED ORDER — DEXAMETHASONE SODIUM PHOSPHATE 10 MG/ML IJ SOLN
INTRAMUSCULAR | Status: DC | PRN
  Administered 2021-04-17: 6 mg via INTRAVENOUS

## 2021-04-17 MED ORDER — ROCURONIUM BROMIDE 10 MG/ML (PF) SYRINGE
PREFILLED_SYRINGE | INTRAVENOUS | Status: DC | PRN
  Administered 2021-04-17: 60 mg via INTRAVENOUS

## 2021-04-17 MED ORDER — ALBUTEROL SULFATE HFA 108 (90 BASE) MCG/ACT IN AERS: 2.0000 | INHALATION_SPRAY | Freq: Four times a day (QID) | RESPIRATORY_TRACT | Status: DC | PRN

## 2021-04-17 MED ORDER — SODIUM CHLORIDE 0.9 % IV SOLN: INTRAVENOUS | Status: DC

## 2021-04-17 MED ORDER — DEXAMETHASONE SODIUM PHOSPHATE 10 MG/ML IJ SOLN
INTRAMUSCULAR | Status: AC
  Filled 2021-04-17: qty 1

## 2021-04-17 MED ORDER — METHOCARBAMOL 500 MG IVPB - SIMPLE MED
500.0000 mg | Freq: Four times a day (QID) | INTRAVENOUS | Status: DC | PRN
  Filled 2021-04-17: qty 50

## 2021-04-17 MED ORDER — GABAPENTIN 400 MG PO CAPS
800.0000 mg | ORAL_CAPSULE | Freq: Three times a day (TID) | ORAL | Status: DC
  Administered 2021-04-17 – 2021-04-18 (×3): 800 mg via ORAL
  Filled 2021-04-17 (×3): qty 2

## 2021-04-17 MED ORDER — LACTATED RINGERS IR SOLN
Status: DC | PRN
  Administered 2021-04-17: 1000 mL

## 2021-04-17 MED ORDER — PHENYLEPHRINE 40 MCG/ML (10ML) SYRINGE FOR IV PUSH (FOR BLOOD PRESSURE SUPPORT)
PREFILLED_SYRINGE | INTRAVENOUS | Status: AC
  Filled 2021-04-17: qty 10

## 2021-04-17 MED ORDER — PHENYLEPHRINE HCL-NACL 20-0.9 MG/250ML-% IV SOLN
INTRAVENOUS | Status: DC | PRN
  Administered 2021-04-17: 50 ug/min via INTRAVENOUS

## 2021-04-17 MED ORDER — CARVEDILOL 25 MG PO TABS
25.0000 mg | ORAL_TABLET | Freq: Two times a day (BID) | ORAL | Status: DC
  Administered 2021-04-17 – 2021-04-18 (×2): 25 mg via ORAL
  Filled 2021-04-17 (×2): qty 1

## 2021-04-17 MED ORDER — ONDANSETRON HCL 4 MG/2ML IJ SOLN
INTRAMUSCULAR | Status: DC | PRN
  Administered 2021-04-17: 4 mg via INTRAVENOUS

## 2021-04-17 MED ORDER — ENOXAPARIN SODIUM 30 MG/0.3ML IJ SOSY
30.0000 mg | PREFILLED_SYRINGE | Freq: Two times a day (BID) | INTRAMUSCULAR | Status: DC
  Administered 2021-04-17 – 2021-04-18 (×2): 30 mg via SUBCUTANEOUS
  Filled 2021-04-17 (×2): qty 0.3

## 2021-04-17 MED ORDER — ROCURONIUM BROMIDE 10 MG/ML (PF) SYRINGE
PREFILLED_SYRINGE | INTRAVENOUS | Status: AC
  Filled 2021-04-17: qty 10

## 2021-04-17 MED ORDER — PHENYLEPHRINE HCL (PRESSORS) 10 MG/ML IV SOLN
INTRAVENOUS | Status: AC
  Filled 2021-04-17: qty 2

## 2021-04-17 MED ORDER — DULOXETINE HCL 30 MG PO CPEP
30.0000 mg | ORAL_CAPSULE | Freq: Two times a day (BID) | ORAL | Status: DC
  Administered 2021-04-17 – 2021-04-18 (×2): 30 mg via ORAL
  Filled 2021-04-17 (×2): qty 1

## 2021-04-17 MED ORDER — BUPIVACAINE LIPOSOME 1.3 % IJ SUSP: 20.0000 mL | Freq: Once | INTRAMUSCULAR | Status: DC

## 2021-04-17 MED ORDER — ALBUTEROL SULFATE (2.5 MG/3ML) 0.083% IN NEBU: 2.5000 mg | INHALATION_SOLUTION | Freq: Four times a day (QID) | RESPIRATORY_TRACT | Status: DC | PRN

## 2021-04-17 MED ORDER — ENSURE MAX PROTEIN PO LIQD
2.0000 [oz_av] | ORAL | Status: DC
  Administered 2021-04-18 (×3): 2 [oz_av] via ORAL

## 2021-04-17 MED ORDER — ACETAMINOPHEN 500 MG PO TABS
1000.0000 mg | ORAL_TABLET | ORAL | Status: AC
  Administered 2021-04-17: 1000 mg via ORAL
  Filled 2021-04-17: qty 2

## 2021-04-17 MED ORDER — HYDROMORPHONE HCL 1 MG/ML IJ SOLN: 0.2500 mg | INTRAMUSCULAR | Status: DC | PRN

## 2021-04-17 MED ORDER — LACTATED RINGERS IV SOLN: INTRAVENOUS | Status: DC

## 2021-04-17 MED ORDER — STERILE WATER FOR IRRIGATION IR SOLN
Status: DC | PRN
  Administered 2021-04-17: 1000 mL

## 2021-04-17 MED ORDER — SUCCINYLCHOLINE CHLORIDE 200 MG/10ML IV SOSY
PREFILLED_SYRINGE | INTRAVENOUS | Status: AC
  Filled 2021-04-17: qty 10

## 2021-04-17 MED ORDER — PANTOPRAZOLE SODIUM 40 MG IV SOLR
40.0000 mg | Freq: Every day | INTRAVENOUS | Status: DC
  Administered 2021-04-17: 40 mg via INTRAVENOUS
  Filled 2021-04-17: qty 40

## 2021-04-17 MED ORDER — ORAL CARE MOUTH RINSE: 15.0000 mL | Freq: Once | OROMUCOSAL | Status: AC

## 2021-04-17 MED ORDER — INSULIN ASPART 100 UNIT/ML IJ SOLN
0.0000 [IU] | INTRAMUSCULAR | Status: DC
  Administered 2021-04-17 (×2): 3 [IU] via SUBCUTANEOUS
  Administered 2021-04-17: 4 [IU] via SUBCUTANEOUS
  Administered 2021-04-18 (×4): 3 [IU] via SUBCUTANEOUS

## 2021-04-17 MED ORDER — CHLORHEXIDINE GLUCONATE 4 % EX LIQD: 60.0000 mL | Freq: Once | CUTANEOUS | Status: DC

## 2021-04-17 MED ORDER — HYDRALAZINE HCL 20 MG/ML IJ SOLN
10.0000 mg | INTRAMUSCULAR | Status: DC | PRN
Start: 2021-04-17 — End: 2021-04-18

## 2021-04-17 MED ORDER — BUPIVACAINE-EPINEPHRINE (PF) 0.25% -1:200000 IJ SOLN
INTRAMUSCULAR | Status: AC
  Filled 2021-04-17: qty 30

## 2021-04-17 MED ORDER — PHENYLEPHRINE 40 MCG/ML (10ML) SYRINGE FOR IV PUSH (FOR BLOOD PRESSURE SUPPORT)
PREFILLED_SYRINGE | INTRAVENOUS | Status: DC | PRN
  Administered 2021-04-17 (×2): 120 ug via INTRAVENOUS
  Administered 2021-04-17 (×2): 80 ug via INTRAVENOUS

## 2021-04-17 MED ORDER — LIDOCAINE 2% (20 MG/ML) 5 ML SYRINGE
INTRAMUSCULAR | Status: DC | PRN
  Administered 2021-04-17: 100 mg via INTRAVENOUS

## 2021-04-17 MED ORDER — HEPARIN SODIUM (PORCINE) 5000 UNIT/ML IJ SOLN
5000.0000 [IU] | INTRAMUSCULAR | Status: AC
  Administered 2021-04-17: 5000 [IU] via SUBCUTANEOUS
  Filled 2021-04-17: qty 1

## 2021-04-17 SURGICAL SUPPLY — 63 items
APPLIER CLIP ROT 10 11.4 M/L (STAPLE)
APPLIER CLIP ROT 13.4 12 LRG (CLIP)
BAG COUNTER SPONGE SURGICOUNT (BAG) IMPLANT
BAG LAPAROSCOPIC 12 15 PORT 16 (BASKET) IMPLANT
BAG RETRIEVAL 12/15 (BASKET)
BENZOIN TINCTURE PRP APPL 2/3 (GAUZE/BANDAGES/DRESSINGS) ×3 IMPLANT
BLADE SURG SZ11 CARB STEEL (BLADE) ×3 IMPLANT
BNDG ADH 1X3 SHEER STRL LF (GAUZE/BANDAGES/DRESSINGS) ×3 IMPLANT
CABLE HIGH FREQUENCY MONO STRZ (ELECTRODE) ×3 IMPLANT
CHLORAPREP W/TINT 26 (MISCELLANEOUS) ×6 IMPLANT
CLIP APPLIE ROT 10 11.4 M/L (STAPLE) IMPLANT
CLIP APPLIE ROT 13.4 12 LRG (CLIP) IMPLANT
COVER SURGICAL LIGHT HANDLE (MISCELLANEOUS) ×3 IMPLANT
DECANTER SPIKE VIAL GLASS SM (MISCELLANEOUS) ×3 IMPLANT
DEVICE SUT QUICK LOAD TK 5 (STAPLE) IMPLANT
DEVICE SUT TI-KNOT TK 5X26 (MISCELLANEOUS) IMPLANT
DRAPE UTILITY XL STRL (DRAPES) ×6 IMPLANT
ELECT REM PT RETURN 15FT ADLT (MISCELLANEOUS) ×3 IMPLANT
GAUZE SPONGE 4X4 12PLY STRL (GAUZE/BANDAGES/DRESSINGS) IMPLANT
GLOVE SURG ENC MOIS LTX SZ6 (GLOVE) ×3 IMPLANT
GLOVE SURG MICRO LTX SZ6 (GLOVE) ×3 IMPLANT
GLOVE SURG UNDER LTX SZ6.5 (GLOVE) ×3 IMPLANT
GOWN STRL REUS W/TWL LRG LVL3 (GOWN DISPOSABLE) ×3 IMPLANT
GOWN STRL REUS W/TWL XL LVL3 (GOWN DISPOSABLE) ×9 IMPLANT
GRASPER SUT TROCAR 14GX15 (MISCELLANEOUS) ×3 IMPLANT
KIT BASIN OR (CUSTOM PROCEDURE TRAY) ×3 IMPLANT
KIT TURNOVER KIT A (KITS) ×3 IMPLANT
MARKER SKIN DUAL TIP RULER LAB (MISCELLANEOUS) ×3 IMPLANT
MAT PREVALON FULL STRYKER (MISCELLANEOUS) ×3 IMPLANT
NEEDLE SPNL 22GX3.5 QUINCKE BK (NEEDLE) ×3 IMPLANT
PACK UNIVERSAL I (CUSTOM PROCEDURE TRAY) ×3 IMPLANT
RELOAD ENDO STITCH (ENDOMECHANICALS) IMPLANT
RELOAD STAPLER BLUE 60MM (STAPLE) ×12 IMPLANT
RELOAD STAPLER GOLD 60MM (STAPLE) ×2 IMPLANT
RELOAD STAPLER GREEN 60MM (STAPLE) ×2 IMPLANT
SCISSORS LAP 5X45 EPIX DISP (ENDOMECHANICALS) ×3 IMPLANT
SET IRRIG TUBING LAPAROSCOPIC (IRRIGATION / IRRIGATOR) ×3 IMPLANT
SET TUBE SMOKE EVAC HIGH FLOW (TUBING) ×3 IMPLANT
SHEARS HARMONIC ACE PLUS 45CM (MISCELLANEOUS) ×3 IMPLANT
SLEEVE ADV FIXATION 5X100MM (TROCAR) ×6 IMPLANT
SLEEVE GASTRECTOMY 40FR VISIGI (MISCELLANEOUS) ×3 IMPLANT
SOL ANTI FOG 6CC (MISCELLANEOUS) ×2 IMPLANT
SOLUTION ANTI FOG 6CC (MISCELLANEOUS) ×1
SPONGE T-LAP 18X18 ~~LOC~~+RFID (SPONGE) ×3 IMPLANT
STAPLER ECHELON BIOABSB 60 FLE (MISCELLANEOUS) IMPLANT
STAPLER ECHELON LONG 60 440 (INSTRUMENTS) ×3 IMPLANT
STAPLER RELOAD BLUE 60MM (STAPLE) ×18
STAPLER RELOAD GOLD 60MM (STAPLE) ×3
STAPLER RELOAD GREEN 60MM (STAPLE) ×3
STRIP CLOSURE SKIN 1/2X4 (GAUZE/BANDAGES/DRESSINGS) ×3 IMPLANT
SUT MNCRL AB 4-0 PS2 18 (SUTURE) ×3 IMPLANT
SUT SURGIDAC NAB ES-9 0 48 120 (SUTURE) IMPLANT
SUT VICRYL 0 TIES 12 18 (SUTURE) ×3 IMPLANT
SYR 10ML ECCENTRIC (SYRINGE) ×3 IMPLANT
SYR 20ML LL LF (SYRINGE) ×3 IMPLANT
SYR 50ML LL SCALE MARK (SYRINGE) ×3 IMPLANT
TOWEL OR 17X26 10 PK STRL BLUE (TOWEL DISPOSABLE) ×3 IMPLANT
TOWEL OR NON WOVEN STRL DISP B (DISPOSABLE) ×3 IMPLANT
TROCAR ADV FIXATION 5X100MM (TROCAR) ×3 IMPLANT
TROCAR BLADELESS 15MM (ENDOMECHANICALS) ×3 IMPLANT
TROCAR BLADELESS OPT 5 100 (ENDOMECHANICALS) ×3 IMPLANT
TUBING CONNECTING 10 (TUBING) ×3 IMPLANT
TUBING ENDO SMARTCAP (MISCELLANEOUS) ×3 IMPLANT

## 2021-04-17 NOTE — Anesthesia Postprocedure Evaluation (Signed)
Anesthesia Post Note  Patient: Maureen Flores  Procedure(s) Performed: LAPAROSCOPIC GASTRIC SLEEVE RESECTION (Abdomen) UPPER GI ENDOSCOPY (Esophagus)     Patient location during evaluation: PACU Anesthesia Type: General Level of consciousness: awake and alert, oriented and patient cooperative Pain management: pain level controlled Vital Signs Assessment: post-procedure vital signs reviewed and stable Respiratory status: spontaneous breathing, nonlabored ventilation and respiratory function stable Cardiovascular status: blood pressure returned to baseline and stable Postop Assessment: no apparent nausea or vomiting Anesthetic complications: no   No notable events documented.  Last Vitals:  Vitals:   04/17/21 0837  BP: (!) 176/89  Pulse: 84  Resp: 18  Temp: 36.5 C  SpO2: 95%    Last Pain:  Vitals:   04/17/21 0847  TempSrc:   PainSc: 0-No pain                 Lannie Fields

## 2021-04-17 NOTE — Op Note (Signed)
Preoperative diagnosis: laparoscopic sleeve gastrectomy  Postoperative diagnosis: Same   Procedure: Upper endoscopy   Surgeon: Feliciana Rossetti, M.D.  Anesthesia: Gen.   Indications for procedure: This patient was undergoing a laparoscopic sleeve gastrectomy.   Description of procedure: The endoscopy was placed in the mouth and into the oropharynx and under endoscopic vision it was advanced to the esophagogastric junction.  The stomach was insufflated and no bleeding or bubbles were seen.  The GEJ was identified at 43 cm from the teeth.  No bleeding or leaks were detected. The scope was withdrawn without difficulty.    Feliciana Rossetti, M.D. General, Bariatric, & Minimally Invasive Surgery [facility]

## 2021-04-17 NOTE — Anesthesia Procedure Notes (Signed)
Procedure Name: Intubation Date/Time: 04/17/2021 10:38 AM Performed by: Pearson Grippe, CRNA Pre-anesthesia Checklist: Patient identified, Emergency Drugs available, Suction available and Patient being monitored Patient Re-evaluated:Patient Re-evaluated prior to induction Oxygen Delivery Method: Circle system utilized Preoxygenation: Pre-oxygenation with 100% oxygen Induction Type: IV induction Ventilation: Mask ventilation without difficulty and Oral airway inserted - appropriate to patient size Laryngoscope Size: Hyacinth Meeker and 2 Grade View: Grade I Tube type: Oral Tube size: 7.0 mm Number of attempts: 1 Airway Equipment and Method: Stylet and Oral airway Placement Confirmation: ETT inserted through vocal cords under direct vision, positive ETCO2 and breath sounds checked- equal and bilateral Secured at: 21 cm Tube secured with: Tape Dental Injury: Teeth and Oropharynx as per pre-operative assessment

## 2021-04-17 NOTE — Progress Notes (Signed)
Patient has ambulated in her room, is using her incentive spirometer, voided, and her vitals are stable. Patient started drinking her first 2oz cup of water at 1500.

## 2021-04-17 NOTE — Progress Notes (Signed)
PHARMACY CONSULT FOR:  Risk Assessment for Post-Discharge VTE Following Bariatric Surgery  Post-Discharge VTE Risk Assessment: This patient's probability of 30-day post-discharge VTE is increased due to the factors marked:   Female    Age >/=60 years   X BMI >/=50 kg/m2    CHF    Dyspnea at Rest    Paraplegia  X  Non-gastric-band surgery    Operation Time >/=3 hr    Return to OR     Length of Stay >/= 3 d   Hx of VTE   Hypercoagulable condition   Significant venous stasis    Predicted probability of 30-day post-discharge VTE: 0.27% - mild risk  Other patient-specific factors to consider: N/A  Recommendation for Discharge: No pharmacologic prophylaxis post-discharge  Maureen Flores is a 50 y.o. female who underwent  laparoscopic gastric sleeve resection on 04/17/21  Case start: 1056 Case end: 1214   Allergies  Allergen Reactions   Other     Cocunut    Patient Measurements: Height: 5\' 6"  (167.6 cm) Weight: (!) 176.7 kg (389 lb 9.6 oz) IBW/kg (Calculated) : 59.3 Body mass index is 62.88 kg/m.  No results for input(s): WBC, HGB, HCT, PLT, APTT, CREATININE, LABCREA, CREATININE, CREAT24HRUR, MG, PHOS, ALBUMIN, PROT, ALBUMIN, AST, ALT, ALKPHOS, BILITOT, BILIDIR, IBILI in the last 72 hours. Estimated Creatinine Clearance: 110.7 mL/min (A) (by C-G formula based on SCr of 1.02 mg/dL (H)).    Past Medical History:  Diagnosis Date   Asthma    Diabetes mellitus without complication (HCC)    type 2   Headache    Hypertension    Obesity    Osteoarthritis      Medications Prior to Admission  Medication Sig Dispense Refill Last Dose   albuterol (VENTOLIN HFA) 108 (90 Base) MCG/ACT inhaler Inhale 2 puffs into the lungs every 6 (six) hours as needed for shortness of breath or wheezing.   04/16/2021   amLODipine (NORVASC) 10 MG tablet Take 10 mg by mouth daily.   04/17/2021   carvedilol (COREG) 25 MG tablet Take 25 mg by mouth 2 (two) times daily with a meal.   04/17/2021 at  0600   DULoxetine (CYMBALTA) 30 MG capsule Take 30 mg by mouth in the morning, at noon, and at bedtime.   04/17/2021 at 0600   fluticasone (FLONASE) 50 MCG/ACT nasal spray Place 2 sprays into both nostrils daily as needed for allergies or rhinitis.   04/16/2021   furosemide (LASIX) 20 MG tablet Take 20 mg by mouth daily.   04/16/2021   gabapentin (NEURONTIN) 800 MG tablet Take 800 mg by mouth 3 (three) times daily.   04/17/2021 at 0600   losartan (COZAAR) 100 MG tablet Take 1 tablet (100 mg total) daily by mouth. 30 tablet 0 04/16/2021   metFORMIN (GLUCOPHAGE-XR) 500 MG 24 hr tablet Take 500 mg by mouth in the morning, at noon, in the evening, and at bedtime.   04/16/2021   montelukast (SINGULAIR) 10 MG tablet Take 10 mg by mouth at bedtime.   04/16/2021   naphazoline-pheniramine (NAPHCON-A) 0.025-0.3 % ophthalmic solution Place 1 drop into both eyes 4 (four) times daily as needed for eye irritation.   04/16/2021   ondansetron (ZOFRAN ODT) 4 MG disintegrating tablet Take 1 tablet (4 mg total) by mouth every 8 (eight) hours as needed for nausea or vomiting. 20 tablet 5 Past Week   pioglitazone (ACTOS) 30 MG tablet Take 30 mg by mouth daily.   Past Week   rizatriptan (MAXALT-MLT)  10 MG disintegrating tablet Take 1 tablet (10 mg total) by mouth as needed for migraine (May repeat in 2 hours.  Max 2 tablets in 24 hours). May repeat in 2 hours if needed 10 tablet 5 Past Week   simvastatin (ZOCOR) 10 MG tablet Take 10 mg by mouth daily.   Past Week   topiramate (TOPAMAX) 50 MG tablet Take 1 tablet (50 mg total) by mouth at bedtime. Take 1/2 tablet at bedtime for one week, then increase to 1 tablet at bedtime (Patient taking differently: Take 50 mg by mouth at bedtime.) 30 tablet 5 04/17/2021 at 0600   triamcinolone (KENALOG) 0.025 % cream Apply 1 application topically daily.   Past Week   triamcinolone lotion (KENALOG) 0.1 % Apply 1 application topically once a week.   Past Week   triamterene-hydrochlorothiazide  (MAXZIDE-25) 37.5-25 MG tablet Take 1 tablet by mouth daily.   04/16/2021    Rexford Maus, PharmD 04/17/2021 2:02 PM

## 2021-04-17 NOTE — Transfer of Care (Signed)
Immediate Anesthesia Transfer of Care Note  Patient: Maureen Flores  Procedure(s) Performed: LAPAROSCOPIC GASTRIC SLEEVE RESECTION (Abdomen) UPPER GI ENDOSCOPY (Esophagus)  Patient Location: PACU  Anesthesia Type:General  Level of Consciousness: awake, alert  and oriented  Airway & Oxygen Therapy: Patient Spontanous Breathing and Patient connected to face mask oxygen  Post-op Assessment: Report given to RN and Post -op Vital signs reviewed and stable  Post vital signs: Reviewed and stable  Last Vitals:  Vitals Value Taken Time  BP 155/78   Temp    Pulse 89 04/17/21 1232  Resp 14 04/17/21 1232  SpO2 96 % 04/17/21 1232  Vitals shown include unvalidated device data.  Last Pain:  Vitals:   04/17/21 0847  TempSrc:   PainSc: 0-No pain         Complications: No notable events documented.

## 2021-04-17 NOTE — Op Note (Signed)
Operative Note  Nilsa Macht  [id number]  [id number]  04/17/2021   Surgeon: Phylliss Blakes MD   Assistant: Feliciana Rossetti MD   Procedure performed: laparoscopic sleeve gastrectomy, upper endoscopy   Preop diagnosis: Morbid obesity Body mass index is 62.88 kg/m. Post-op diagnosis/intraop findings: same   Specimens: fundus Retained items: none  EBL: minimal  Complications: none   Description of procedure: After obtaining informed consent and administration of chemical DVT prophylaxis in holding, the patient was taken to the operating room and placed supine on operating room table where general endotracheal anesthesia was initiated, preoperative antibiotics were administered, SCDs applied, and a formal timeout was performed. The abdomen was prepped and draped in usual sterile fashion. Peritoneal access was gained using a Visiport technique in the left upper quadrant and insufflation to 15 mmHg ensued without issue. Gross inspection revealed no evidence of injury. Under direct visualization three more 5 mm trochars were placed in the right and left hemiabdomen and the 14mm trocar in the right paramedian upper abdomen. Bilateral laparoscopic assisted TAPS blocks were performed with Exparel diluted with 0.25 percent Marcaine with epinephrine. The patient was placed in steep Trendelenburg and the liver retractor was introduced through an incision in the upper midline and secured to the post externally to maintain the left lobe retracted anteriorly.  There is no hiatal hernia on direct inspection. Using the Harmonic scalpel, the greater curvature of the stomach was dissected away from the greater omentum and short gastric vessels were divided. This began 6 cm from the pylorus, and dissection proceeded until the left crus was clearly exposed. There were some filmy adhesions of the posterior stomach to the pancreas which were divided with the Harmonic. Esophageal fat pad was mobilized off the anterior  stomach slightly. The 19 [place] VisiGi was then introduced and directed down towards the pylorus. This was placed to suction against the lesser curve. Serial fires of the linear cutting stapler were then employed to create our sleeve. The first fire used a green load and ensured adequate room at the angularis incisura. One gold load and then several blue loads were then employed to create a narrow tubular stomach up to the angle of His. The excised stomach was then removed through our 15 mm trocar site within an Endo Catch bag.  The visigi was taken off of suction and a few puffs of air were introduced, inflating the sleeve. No bubbles were observed in the irrigation fluid around the stomach and the shape was noted to be evenly tubular without any narrowing at the angularis. The visigi was then removed. Upper endoscopy was performed by the assistant surgeon and the sleeve was noted to be airtight, the staple line was hemostatic. Please see his separate note. The endoscope was removed. A small amount of oozing on the proximal staple line was addressed with clips. The 15 mm trocar site fascia in the right upper abdomen was closed with a 0 Vicryl using the laparoscopic suture passer under direct visualization. The liver retractor was removed under direct visualization. The abdomen was then desufflated and all remaining trochars removed. The skin incisions were closed with subcuticular Monocryl; benzoin, Steri-Strips and Band-Aids were applied The patient was then awakened, extubated and taken to PACU in stable condition.     All counts were correct at the completion of the case.

## 2021-04-17 NOTE — Progress Notes (Signed)

## 2021-04-17 NOTE — Discharge Instructions (Signed)

## 2021-04-17 NOTE — Interval H&P Note (Signed)
History and Physical Interval Note:  04/17/2021 8:59 AM  Ryanne Shannon  has presented today for surgery, with the diagnosis of morbid obesity.  The various methods of treatment have been discussed with the patient and family. After consideration of risks, benefits and other options for treatment, the patient has consented to  Procedure(s): LAPAROSCOPIC GASTRIC SLEEVE RESECTION (N/A) UPPER GI ENDOSCOPY (N/A) as a surgical intervention.  The patient's history has been reviewed, patient examined, no change in status, stable for surgery.  I have reviewed the patient's chart and labs.  Questions were answered to the patient's satisfaction.     Tavaris Eudy Lollie Sails

## 2021-04-18 ENCOUNTER — Encounter (HOSPITAL_COMMUNITY): Payer: Self-pay | Admitting: Surgery

## 2021-04-18 ENCOUNTER — Other Ambulatory Visit (HOSPITAL_COMMUNITY): Payer: Self-pay

## 2021-04-18 LAB — COMPREHENSIVE METABOLIC PANEL
ALT: 26 U/L (ref 0–44)
AST: 24 U/L (ref 15–41)
Albumin: 3.4 g/dL — ABNORMAL LOW (ref 3.5–5.0)
Alkaline Phosphatase: 71 U/L (ref 38–126)
Anion gap: 13 (ref 5–15)
BUN: 24 mg/dL — ABNORMAL HIGH (ref 6–20)
CO2: 23 mmol/L (ref 22–32)
Calcium: 9 mg/dL (ref 8.9–10.3)
Chloride: 103 mmol/L (ref 98–111)
Creatinine, Ser: 0.97 mg/dL (ref 0.44–1.00)
GFR, Estimated: >60 mL/min (ref >60)
Glucose, Bld: 138 mg/dL — ABNORMAL HIGH (ref 70–99)
Potassium: 3.6 mmol/L (ref 3.5–5.1)
Sodium: 139 mmol/L (ref 135–145)
Total Bilirubin: 0.6 mg/dL (ref 0.3–1.2)
Total Protein: 7.1 g/dL (ref 6.5–8.1)

## 2021-04-18 LAB — CBC WITH DIFFERENTIAL/PLATELET
Abs Immature Granulocytes: 0.07 10*3/uL (ref 0.00–0.07)
Basophils Absolute: 0 10*3/uL (ref 0.0–0.1)
Basophils Relative: 0 %
Eosinophils Absolute: 0 10*3/uL (ref 0.0–0.5)
Eosinophils Relative: 0 %
HCT: 41.8 % (ref 36.0–46.0)
Hemoglobin: 13.4 g/dL (ref 12.0–15.0)
Immature Granulocytes: 1 %
Lymphocytes Relative: 9 %
Lymphs Abs: 1.1 10*3/uL (ref 0.7–4.0)
MCH: 28 pg (ref 26.0–34.0)
MCHC: 32.1 g/dL (ref 30.0–36.0)
MCV: 87.3 fL (ref 80.0–100.0)
Monocytes Absolute: 0.4 10*3/uL (ref 0.1–1.0)
Monocytes Relative: 3 %
Neutro Abs: 11.4 10*3/uL — ABNORMAL HIGH (ref 1.7–7.7)
Neutrophils Relative %: 87 %
Platelets: 334 10*3/uL (ref 150–400)
RBC: 4.79 MIL/uL (ref 3.87–5.11)
RDW: 14.6 % (ref 11.5–15.5)
WBC: 13 10*3/uL — ABNORMAL HIGH (ref 4.0–10.5)
nRBC: 0 % (ref 0.0–0.2)

## 2021-04-18 LAB — MAGNESIUM: Magnesium: 2 mg/dL (ref 1.7–2.4)

## 2021-04-18 LAB — GLUCOSE, CAPILLARY
Glucose-Capillary: 123 mg/dL — ABNORMAL HIGH (ref 70–99)
Glucose-Capillary: 138 mg/dL — ABNORMAL HIGH (ref 70–99)
Glucose-Capillary: 141 mg/dL — ABNORMAL HIGH (ref 70–99)

## 2021-04-18 MED ORDER — DOCUSATE SODIUM 100 MG PO CAPS
100.0000 mg | ORAL_CAPSULE | Freq: Two times a day (BID) | ORAL | 0 refills | Status: AC
  Filled 2021-04-18: qty 30, 15d supply, fill #0

## 2021-04-18 MED ORDER — ACETAMINOPHEN 500 MG PO TABS: 1000.0000 mg | ORAL_TABLET | Freq: Three times a day (TID) | ORAL | 0 refills | Status: AC

## 2021-04-18 MED ORDER — LOSARTAN POTASSIUM 100 MG PO TABS: 50.0000 mg | ORAL_TABLET | Freq: Every day | ORAL | 0 refills | Status: AC

## 2021-04-18 MED ORDER — ONDANSETRON 4 MG PO TBDP
4.0000 mg | ORAL_TABLET | Freq: Four times a day (QID) | ORAL | 0 refills | Status: DC | PRN
  Filled 2021-04-18: qty 20, 5d supply, fill #0

## 2021-04-18 MED ORDER — POTASSIUM CHLORIDE 20 MEQ PO PACK
40.0000 meq | PACK | Freq: Once | ORAL | Status: AC
  Administered 2021-04-18: 40 meq via ORAL
  Filled 2021-04-18: qty 2

## 2021-04-18 MED ORDER — PANTOPRAZOLE SODIUM 40 MG PO TBEC
40.0000 mg | DELAYED_RELEASE_TABLET | Freq: Every day | ORAL | 0 refills | Status: DC
  Filled 2021-04-18: qty 30, 30d supply, fill #0

## 2021-04-18 MED ORDER — TRAMADOL HCL 50 MG PO TABS
50.0000 mg | ORAL_TABLET | Freq: Four times a day (QID) | ORAL | 0 refills | Status: DC | PRN
  Filled 2021-04-18: qty 10, 3d supply, fill #0

## 2021-04-18 NOTE — Progress Notes (Signed)
S: No issues over night. No pain or nausea. Tolerating liquids without issue. Walking in halls.   O: Vitals, labs, intake/output, and orders reviewed at this time. Afebrile, no tachycardia. K 3.6- will replace PO. Otherwise labs unremarkable   Gen: A&Ox3, no distress  H&N: EOMI, atraumatic, neck supple Chest: unlabored respirations, RRR Abd: soft, nontender, nondistended, incision(s) c/d/i with steris, no cellulitis or hematoma Ext: warm, no edema Neuro: grossly normal  Lines/tubes/drains: PIV  A/P: POD 1 s/p sleeve gastrectomy -Continue liquids, ambulate, pulm toilet -Discharge later today   Phylliss Blakes, MD [facility], [place]

## 2021-04-18 NOTE — Progress Notes (Signed)
Nutrition Education Note ° °Received consult for diet education for patient s/p bariatric surgery. ° °Discussed 2 week post op diet with pt. Emphasized that liquids must be non carbonated, non caffeinated, and sugar free. Fluid goals discussed. Pt to follow up with outpatient bariatric RD for further diet progression after 2 weeks. Multivitamins and minerals also reviewed. Teach back method used, pt expressed understanding, expect good compliance. ° °If nutrition issues arise, please consult RD. ° °Janika Jedlicka, MS, RD, LDN °Inpatient Clinical Dietitian °Contact information available via Amion ° ° °

## 2021-04-18 NOTE — Progress Notes (Signed)
Discharge instructions given to patient and all questions were answered.  

## 2021-04-18 NOTE — Progress Notes (Signed)
24hr fluid recall prior to discharge: 900mL.  Per dehydration protocol, will call pt to f/u within one week post op. 

## 2021-04-18 NOTE — Progress Notes (Signed)
Patient alert and oriented, pain is controlled. Patient is tolerating fluids, advanced to protein shake today, patient is tolerating well.  Reviewed Gastric sleeve discharge instructions with patient and patient is able to articulate understanding.  Provided information on BELT program, Support Group and [facility] outpatient pharmacy. All questions answered, will continue to monitor.  

## 2021-04-19 LAB — SURGICAL PATHOLOGY

## 2021-04-19 NOTE — Discharge Summary (Signed)
Physician Discharge Summary  Maureen Flores MRN:[id number] DOB: 12/02/1970 DOA: 04/17/2021  PCP: Aguiar, Rafaela M, MD  Admit date: 04/17/2021 Discharge date: 04/18/2021  Recommendations for Outpatient Follow-up:    Follow-up Information     Connor, Chelsea A, MD. Go on 05/12/2021.   Specialty: General Surgery Why: at 9:20am.  Please arrive 15 minutes prior to your appointment time.  Thank you. Contact information: [address] [place] [address] [phone]         Surgery, [place]. Go on 06/15/2021.   Specialty: General Surgery Why: at 11:30am with Dr. Connor.  Please arrive 15 minutes prior to your appointment time.  Thank you. Contact information: [address] [place] [address] [phone]                Discharge Diagnoses:  Active Problems:   Morbid obesity (HCC)   Surgical Procedure: Laparoscopic Sleeve Gastrectomy, upper endoscopy  Discharge Condition: Good Disposition: Home  Diet recommendation: Postoperative sleeve gastrectomy diet (liquids only)  Filed Weights   04/17/21 0837  Weight: (!) 176.7 kg     Hospital Course:  The patient was admitted for a planned laparoscopic sleeve gastrectomy. Please see operative note. Preoperatively the patient was given 5000 units of subcutaneous heparin for DVT prophylaxis. Postoperative prophylactic Lovenox dosing was started on the evening of postoperative day 0. ERAS protocol was used. On the evening of postoperative day 0, the patient was started on water and ice chips. On postoperative day 1 the patient had no fever or tachycardia and was tolerating water in their diet was gradually advanced throughout the day. The patient was ambulating without difficulty. Their vital signs are stable without fever or tachycardia. Their hemoglobin had remained stable. The patient had received discharge instructions and counseling. They were deemed stable for discharge and had met  discharge criteria   Discharge Instructions   Allergies as of 04/18/2021       Reactions   Other    Cocunut        Medication List     STOP taking these medications    furosemide 20 MG tablet Commonly known as: LASIX   metFORMIN 500 MG 24 hr tablet Commonly known as: GLUCOPHAGE-XR   pioglitazone 30 MG tablet Commonly known as: ACTOS   triamterene-hydrochlorothiazide 37.5-25 MG tablet Commonly known as: MAXZIDE-25       TAKE these medications    acetaminophen 500 MG tablet Commonly known as: TYLENOL Take 2 tablets (1,000 mg total) by mouth every 8 (eight) hours for 5 days.   albuterol 108 (90 Base) MCG/ACT inhaler Commonly known as: VENTOLIN HFA Inhale 2 puffs into the lungs every 6 (six) hours as needed for shortness of breath or wheezing.   amLODipine 10 MG tablet Commonly known as: NORVASC Take 10 mg by mouth daily. Notes to patient: Monitor Blood Pressure Daily and keep a log for primary care physician.  You may need to make changes to your medications with rapid weight loss.     carvedilol 25 MG tablet Commonly known as: COREG Take 25 mg by mouth 2 (two) times daily with a meal. Notes to patient: Monitor Blood Pressure Daily and keep a log for primary care physician.  You may need to make changes to your medications with rapid weight loss.     docusate sodium 100 MG capsule Commonly known as: Colace Take 1 capsule (100 mg total) by mouth 2 (two) times daily. Okay to decrease to once daily or   stop taking if having loose bowel movements   DULoxetine 30 MG capsule Commonly known as: CYMBALTA Take 30 mg by mouth in the morning, at noon, and at bedtime.   fluticasone 50 MCG/ACT nasal spray Commonly known as: FLONASE Place 2 sprays into both nostrils daily as needed for allergies or rhinitis.   gabapentin 800 MG tablet Commonly known as: NEURONTIN Take 800 mg by mouth 3 (three) times daily.   losartan 100 MG tablet Commonly known as: Cozaar Take  0.5 tablets (50 mg total) by mouth daily. What changed: how much to take   montelukast 10 MG tablet Commonly known as: SINGULAIR Take 10 mg by mouth at bedtime.   naphazoline-pheniramine 0.025-0.3 % ophthalmic solution Commonly known as: NAPHCON-A Place 1 drop into both eyes 4 (four) times daily as needed for eye irritation.   ondansetron 4 MG disintegrating tablet Commonly known as: Zofran ODT Take 1 tablet (4 mg total) by mouth every 8 (eight) hours as needed for nausea or vomiting. What changed: Another medication with the same name was added. Make sure you understand how and when to take each.   ondansetron 4 MG disintegrating tablet Commonly known as: ZOFRAN-ODT Dissolve 1 tablet by mouth every 6 (six) hours as needed for nausea or vomiting. What changed: You were already taking a medication with the same name, and this prescription was added. Make sure you understand how and when to take each.   pantoprazole 40 MG tablet Commonly known as: PROTONIX Take 1 tablet (40 mg total) by mouth daily.   rizatriptan 10 MG disintegrating tablet Commonly known as: Maxalt-MLT Take 1 tablet (10 mg total) by mouth as needed for migraine (May repeat in 2 hours.  Max 2 tablets in 24 hours). May repeat in 2 hours if needed   simvastatin 10 MG tablet Commonly known as: ZOCOR Take 10 mg by mouth daily.   topiramate 50 MG tablet Commonly known as: TOPAMAX Take 1 tablet (50 mg total) by mouth at bedtime. Take 1/2 tablet at bedtime for one week, then increase to 1 tablet at bedtime What changed: additional instructions   traMADol 50 MG tablet Commonly known as: ULTRAM Take 1 tablet (50 mg total) by mouth every 6 (six) hours as needed (pain).   triamcinolone 0.025 % cream Commonly known as: KENALOG Apply 1 application topically daily.   triamcinolone lotion 0.1 % Commonly known as: KENALOG Apply 1 application topically once a week.        Follow-up Information     Clovis Riley, MD. Go on 05/12/2021.   Specialty: General Surgery Why: at 9:20am.  Please arrive 15 minutes prior to your appointment time.  Thank you. Contact information: [address] [place] [address] [phone]         Surgery, [place]. Go on 06/15/2021.   Specialty: General Surgery Why: at 11:30am with Dr. Kae Heller.  Please arrive 15 minutes prior to your appointment time.  Thank you. Contact information: [place] [address] [phone]                  The results of significant diagnostics from this hospitalization (including imaging, microbiology, ancillary and laboratory) are listed below for reference.    Significant Diagnostic Studies: No results found.  Labs: Basic Metabolic Panel: Recent Labs  Lab 04/18/21 0710  NA 139  K 3.6  CL 103  CO2 23  GLUCOSE 138*  BUN 24*  CREATININE 0.97  CALCIUM 9.0  MG 2.0   Liver Function Tests: Recent Labs  Lab 04/18/21 0710  AST 24  ALT 26  ALKPHOS 71  BILITOT 0.6  PROT 7.1  ALBUMIN 3.4*    CBC: Recent Labs  Lab 04/18/21 0442  WBC 13.0*  NEUTROABS 11.4*  HGB 13.4  HCT 41.8  MCV 87.3  PLT 334    CBG: Recent Labs  Lab 04/17/21 2025 04/17/21 2345 04/18/21 0358 04/18/21 0751 04/18/21 1119  GLUCAP 145* 135* 123* 138* 141*    Active Problems:   Morbid obesity ([place])     Signed:  [facility], [place] [phone] 04/19/2021, 10:35 AM

## 2021-04-21 ENCOUNTER — Telehealth (HOSPITAL_COMMUNITY): Payer: Self-pay | Admitting: *Deleted

## 2021-04-21 ENCOUNTER — Other Ambulatory Visit (HOSPITAL_COMMUNITY): Payer: Self-pay

## 2021-04-21 NOTE — Telephone Encounter (Signed)
1.  Tell me about your pain and pain management? Pt c/o incisional pain with movement and exertion.  Pt states that she has been taking Tylenol.  Pt has not taken any tramadol. Discussed with patient to try and splint her abdomen with changing positions to assist with the discomfort.  Encouraged pt to try options and/or contact [facility] if still concerned.   2.  Let's talk about fluid intake.  How much total fluid are you taking in? Pt states that she is getting in more than 64oz of fluid including protein shakes, bottled water, and broth.  Pt was able to consume 98oz of fluid yesterday.  3.  How much protein have you taken in the last 2 days? Pt states she is meeting her goal of 60g of protein each day with the protein shakes.  4.  Have you had nausea?  Tell me about when have experienced nausea and what you did to help? Pt denies nausea.   5.  Has the frequency or color changed with your urine? Pt states that she is urinating "fine" with no changes in frequency or urgency.     6.  Tell me what your incisions look like? "Incisions look fine". Pt denies a fever, chills.  Pt states incisions are not swollen, open, or draining.  Pt encouraged to call [facility] if incisions change.   7.  Have you been passing gas? BM? Pt states that she had 5 "loose stools" yesterday.  Pt denies any BMs today.     8.  If a problem or question were to arise who would you call?  Do you know contact numbers for [facility], [facility], and [facility]? Pt denies dehydration symptoms today and states that she "feels fine".  Pt can describe s/sx of dehydration.  Pt knows to call [facility] for surgical, [facility] for nutrition, and [facility] for non-urgent questions or concerns.   9.  How has the walking going? Pt states she is walking around and able to be active without difficulty.   10. Are you still using your incentive spirometer?  If so, how often? Pt states that she is using her I.S. Pt encouraged to use incentive spirometer, at least 10x every hour while  awake until s/he sees the surgeon.  11.  How are your vitamins and calcium going?  How are you taking them? Pt states that she is taking her supplements and vitamins without difficulty.  Reminded patient that the first 30 days post-operatively are important for successful recovery.  Practice good hand hygiene, wearing a mask when appropriate (since optional in most places), and minimizing exposure to people who live outside of the home, especially if they are exhibiting any respiratory, GI, or illness-like symptoms.

## 2021-05-02 ENCOUNTER — Other Ambulatory Visit: Payer: Self-pay

## 2021-05-02 ENCOUNTER — Encounter: Payer: 59 | Attending: Surgery | Admitting: Skilled Nursing Facility1

## 2021-05-03 NOTE — Progress Notes (Signed)
2 Week Post-Operative Nutrition Class   Patient was seen on 05/02/2021 for Post-Operative Nutrition education at the [facility].    Surgery date: 04/17/2021 Surgery type: sleeve Start weight at [facility]: 408 pounds Weight today: 376 pounds Bowel Habits: Every day to every other day no complaints   Body Composition Scale 05/02/2021  Current Body Weight 376  Total Body Fat % 53.1  Visceral Fat 28  Fat-Free Mass % 46.8   Total Body Water % 37.9  Muscle-Mass lbs 33.3  BMI 62.2  Body Fat Displacement          Torso  lbs 124.2         Left Leg  lbs 24.8         Right Leg  lbs 24.8         Left Arm  lbs 12.4         Right Arm   lbs 12.4      The following the learning objectives were met by the patient during this course: Identifies Phase 3 (Soft, High Proteins) Dietary Goals and will begin from 2 weeks post-operatively to 2 months post-operatively Identifies appropriate sources of fluids and proteins  Identifies appropriate fat sources and healthy verses unhealthy fat types   States protein recommendations and appropriate sources post-operatively Identifies the need for appropriate texture modifications, mastication, and bite sizes when consuming solids Identifies appropriate fat consumption and sources Identifies appropriate multivitamin and calcium sources post-operatively Describes the need for physical activity post-operatively and will follow MD recommendations States when to call healthcare provider regarding medication questions or post-operative complications   Handouts given during class include: Phase 3A: Soft, High Protein Diet Handout Phase 3 High Protein Meals Healthy Fats   Follow-Up Plan: Patient will follow-up at [facility] in 6 weeks for 2 month post-op nutrition visit for diet advancement per MD.

## 2021-05-08 ENCOUNTER — Telehealth: Payer: Self-pay | Admitting: Skilled Nursing Facility1

## 2021-05-08 NOTE — Telephone Encounter (Signed)
RD called pt to verify fluid intake once starting soft, solid proteins 2 week post-bariatric surgery.   Daily Fluid intake: 64 oz Daily Protein intake: 60 g Bowel Habits: every day to every other day  Concerns/issues:   Pt states she has been doing just one day of all liquid from feeling nausea: dietitian advised that is fine that is what they are there for, for not great days.   Pt states she is still learning to eat slower and chew well

## 2021-05-11 ENCOUNTER — Other Ambulatory Visit (HOSPITAL_COMMUNITY): Payer: Self-pay

## 2021-06-01 ENCOUNTER — Ambulatory Visit (HOSPITAL_COMMUNITY)
Admission: RE | Admit: 2021-06-01 | Discharge: 2021-06-01 | Disposition: A | Discharge status: Home / Self Care | Payer: 59 | Source: Ambulatory Visit | Attending: Surgery | Admitting: Surgery

## 2021-06-01 ENCOUNTER — Other Ambulatory Visit (HOSPITAL_COMMUNITY): Payer: Self-pay | Admitting: Surgery

## 2021-06-01 ENCOUNTER — Other Ambulatory Visit: Payer: Self-pay

## 2021-06-01 DIAGNOSIS — M79606 Pain in leg, unspecified: Secondary | ICD-10-CM

## 2021-06-01 DIAGNOSIS — M7989 Other specified soft tissue disorders: Secondary | ICD-10-CM

## 2021-06-01 NOTE — Progress Notes (Signed)
Bilateral lower extremity venous duplex has been completed. Preliminary results can be found in CV Proc through chart review.  Results were given to Raven at Dr. Derrill Memo office.  06/01/21 4:23 PM Olen Cordial RVT

## 2021-06-14 ENCOUNTER — Encounter: Payer: 59 | Attending: Surgery | Admitting: Skilled Nursing Facility1

## 2021-06-14 ENCOUNTER — Other Ambulatory Visit: Payer: Self-pay

## 2021-06-14 NOTE — Progress Notes (Signed)
Bariatric Nutrition Follow-Up Visit Medical Nutrition Therapy    NUTRITION ASSESSMENT    Surgery date: 04/17/2021 Surgery type: sleeve Start weight at [facility]: 408 pounds Weight today: 365.2 pounds   Body Composition Scale 05/02/2021 06/14/2021  Current Body Weight 376 365.2  Total Body Fat % 53.1 52.5  Visceral Fat 28 27  Fat-Free Mass % 46.8 47.4   Total Body Water % 37.9 38.2  Muscle-Mass lbs 33.3 33.4  BMI 62.2 59.7  Body Fat Displacement           Torso  lbs 124.2 119.2         Left Leg  lbs 24.8 23.8         Right Leg  lbs 24.8 23.8         Left Arm  lbs 12.4 11.9         Right Arm   lbs 12.4 11.9   Clinical  Medical hx: HTN, arthritis, diabetes Medications: see list; actos, metformin  Labs: HDL 29, A1C 8.1 Notable signs/symptoms: none stated Any previous deficiencies? No   Lifestyle & Dietary Hx  Pt states she did know it was possible to not tolerate beef/chicken/pork but now that she is here it does not change the fact it is vexing. Pt state she is also vexed because she stays constipated.  Pt states she only eats solid foods at dinner for fear she will not feel well during work.  Pt states she is feeling tired and low energy. Pt states she is exhausted after her workouts.  Pt states she has been Trying to turn her mind away from the scale listening to her body. Pt states she has been trying not to look at other peoples journey and just focus in her success.  Pt states she does forget to drink often enough throughout the day.   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  24-Hr Dietary Recall: tries to eat greek yogurt every 3 hours First Meal: protein shake Snack: greek yogurt  Second Meal: greek yogurt Snack: greek yogurt  Third Meal: salmon or shrimp or flounder Snack:  Beverages: water + flavoring, water  Post-Op Goals/  Signs/ Symptoms Using straws: no Drinking while eating: no Chewing/swallowing difficulties: no Changes in vision: no Changes to mood/headaches: no Hair loss/changes to skin/nails: no Difficulty focusing/concentrating: no Sweating: no Limb weakness: no Dizziness/lightheadedness: no Palpitations: no  Carbonated/caffeinated beverages: no N/V/D/C/Gas: no Abdominal pain: no Dumping syndrome: no    NUTRITION DIAGNOSIS  Overweight/obesity ([place]-3.3) related to past poor dietary habits and physical inactivity as evidenced by completed bariatric surgery and following dietary guidelines for continued weight loss and healthy nutrition status.     NUTRITION INTERVENTION Nutrition counseling (C-1) and education (E-2) to facilitate bariatric surgery goals, including: Diet advancement to the next phase (phase 4) now including non starchy vegetables The importance of consuming adequate calories as well as certain nutrients daily due to the body's need for essential vitamins, minerals, and fats The importance of daily physical activity and to reach a goal of at least 150 minutes of moderate to vigorous physical activity weekly (or as directed by their physician) due to benefits such as increased musculature and improved lab values The importance of intuitive eating specifically learning hunger-satiety cues and understanding the importance of learning a new body: The importance of mindful eating to avoid grazing behaviors   Goals: -Continue to  aim for a minimum of 64 fluid ounces 7 days a week with at least 30 ounces being plain water  -Eat non-starchy vegetables 2 times a day 7 days a week  -Start out with soft cooked vegetables today and tomorrow; if tolerated begin to eat raw vegetables or cooked including salads  -Eat your 3 ounces of protein first then start in on your non-starchy vegetables; once you understand how much of your meal leads to satisfaction and not full while still eating 3 ounces  of protein and non-starchy vegetables you can eat them in any order   -Continue to aim for 30 minutes of activity at least 5 times a week  -Do NOT cook with/add to your food: alfredo sauce, cheese sauce, barbeque sauce, ketchup, fat back, butter, bacon grease, grease, Crisco, OR SUGAR   Handouts Provided Include  Phae 4  Learning Style & Readiness for Change Teaching method utilized: Visual & Auditory  Demonstrated degree of understanding via: Teach Back  Readiness Level: Action Barriers to learning/adherence to lifestyle change: small fear of eating  RD's Notes for Next Visit Assess adherence to pt chosen goals    MONITORING & EVALUATION Dietary intake, weekly physical activity, body weight  Next Steps Patient is to follow-up in February

## 2021-06-19 NOTE — Progress Notes (Deleted)
NEUROLOGY FOLLOW UP OFFICE NOTE  Maureen Flores [id number]  Assessment/Plan:   Chronic migraine without aura, without status migrainosus, not intractable Low back pain with right-sided sciatica Excessive daytime sleepiness  Migraine prevention:  *** Migraine rescue:  *** Limit use of pain relievers to no more than 2 days out of week to prevent risk of rebound or medication-overuse headache. Keep headache diary Follow up ***   Subjective:  Maureen Flores is a 50 year old female with HTN and type 2 diabetes mellitus who follows up for migraines.  UPDATE: Started topiramate in May.   Referred to sleep medicine *** Referred to PT for back pain.  ***  Intensity:  *** Duration:  *** Frequency:  *** Frequency of abortive medication: *** Current NSAIDS/analgesics:  Excedrin, tramadol Current triptans:  rizatriptan 10mg  Current ergotamine:  none Current anti-emetic:  Zofran 4mg  Current muscle relaxants:  none Current Antihypertensive medications:  Amlodipine, losartan, triamterene-HCTZ, Current Antidepressant medications:  Cymbalta 30mg  TID Current Anticonvulsant medications:  Topiramate 50mg  at bedtime, gabapentin 800mg  TID (arthritic pain) Current anti-CGRP:  none Current Vitamins/Herbal/Supplements:  none Current Antihistamines/Decongestants:  Flonase Other therapy:  none Hormone/birth control:  none  Caffeine:  Cut back on tea -2-3 times a week.  No coffee Diet:  Hydrates.  Eats one meal a day but may also drink a smoothie or eat granola during the day Exercise:  no Depression:  no Other pain:  Knee pain, athritis.  She has chronic low back pain and reports flare up of right sided sciatica Sleep hygiene:  OK.  Gets 6-8 hours a night. Sometimes fatigued during the day  HISTORY:  She has history of mild headaches for several years.  They occurred 2-3 days a week and responded well to naproxen or Goody's.  After she had Covid in December 2020, the headaches became daily.   Currently they are severe occipital or bi-frontotemporal pain lasting 1 to 2 days and occurring 1 to 2 times a week.  There is associated with bilateral eye pain, nausea, photophobia and phonophobia.  She initially was taking naproxen and Goody's daily, then Excedrin.  Excedrin eases the pain but does not abort it.  CT of head on 08/28/2019 showed slight calcification in the right carotid siphon region but no acute intracranial abnormality.    Past NSAIDS/analgesics:  Tramadol, naproxen, Goody's Past abortive triptans:  none Past abortive ergotamine:  none Past muscle relaxants:  none Past anti-emetic:  Compazine Past antihypertensive medications:  Amlodipine, nebivolol, carvedilol, losartain, furosemide Past antidepressant medications:  Duloxetine 90mg  QD Past anticonvulsant medications:  topiramate Past anti-CGRP:  none Past vitamins/Herbal/Supplements:  none Past antihistamines/decongestants:  none Other past therapies:  none   .  PAST MEDICAL HISTORY: Past Medical History:  Diagnosis Date   Asthma    Diabetes mellitus without complication (HCC)    type 2   Headache    Hypertension    Obesity    Osteoarthritis     MEDICATIONS: Current Outpatient Medications on File Prior to Visit  Medication Sig Dispense Refill   albuterol (VENTOLIN HFA) 108 (90 Base) MCG/ACT inhaler Inhale 2 puffs into the lungs every 6 (six) hours as needed for shortness of breath or wheezing.     amLODipine (NORVASC) 10 MG tablet Take 10 mg by mouth daily.     carvedilol (COREG) 25 MG tablet Take 25 mg by mouth 2 (two) times daily with a meal.     DULoxetine (CYMBALTA) 30 MG capsule Take 30 mg by mouth in the morning,  at noon, and at bedtime.     fluticasone (FLONASE) 50 MCG/ACT nasal spray Place 2 sprays into both nostrils daily as needed for allergies or rhinitis.     gabapentin (NEURONTIN) 800 MG tablet Take 800 mg by mouth 3 (three) times daily.     losartan (COZAAR) 100 MG tablet Take 0.5 tablets (50  mg total) by mouth daily. 30 tablet 0   montelukast (SINGULAIR) 10 MG tablet Take 10 mg by mouth at bedtime.     naphazoline-pheniramine (NAPHCON-A) 0.025-0.3 % ophthalmic solution Place 1 drop into both eyes 4 (four) times daily as needed for eye irritation.     ondansetron (ZOFRAN ODT) 4 MG disintegrating tablet Take 1 tablet (4 mg total) by mouth every 8 (eight) hours as needed for nausea or vomiting. 20 tablet 5   ondansetron (ZOFRAN-ODT) 4 MG disintegrating tablet Dissolve 1 tablet by mouth every 6 (six) hours as needed for nausea or vomiting. 20 tablet 0   pantoprazole (PROTONIX) 40 MG tablet Take 1 tablet (40 mg total) by mouth daily. 90 tablet 0   rizatriptan (MAXALT-MLT) 10 MG disintegrating tablet Take 1 tablet (10 mg total) by mouth as needed for migraine (May repeat in 2 hours.  Max 2 tablets in 24 hours). May repeat in 2 hours if needed 10 tablet 5   simvastatin (ZOCOR) 10 MG tablet Take 10 mg by mouth daily.     topiramate (TOPAMAX) 50 MG tablet Take 1 tablet (50 mg total) by mouth at bedtime. Take 1/2 tablet at bedtime for one week, then increase to 1 tablet at bedtime (Patient taking differently: Take 50 mg by mouth at bedtime.) 30 tablet 5   traMADol (ULTRAM) 50 MG tablet Take 1 tablet (50 mg total) by mouth every 6 (six) hours as needed (pain). 10 tablet 0   triamcinolone (KENALOG) 0.025 % cream Apply 1 application topically daily.     triamcinolone lotion (KENALOG) 0.1 % Apply 1 application topically once a week.     No current facility-administered medications on file prior to visit.    ALLERGIES: Allergies  Allergen Reactions   Other     [place]    FAMILY HISTORY: No family history on file.    Objective:  *** General: No acute distress.  Patient appears ***-groomed.   Head:  Normocephalic/atraumatic Eyes:  Fundi examined but not visualized Neck: supple, no paraspinal tenderness, full range of motion Heart:  Regular rate and rhythm Lungs:  Clear to auscultation  bilaterally Back: No paraspinal tenderness Neurological Exam: alert and oriented to person, place, and time.  Speech fluent and not dysarthric, language intact.  CN II-XII intact. Bulk and tone normal, muscle strength 5/5 throughout.  Sensation to light touch intact.  Deep tendon reflexes 2+ throughout, toes downgoing.  Finger to nose testing intact.  Gait normal, Romberg negative.   Shon Millet, DO  CC: ***

## 2021-06-20 ENCOUNTER — Ambulatory Visit: Payer: 59 | Admitting: Neurology

## 2021-06-21 ENCOUNTER — Encounter (HOSPITAL_COMMUNITY): Admission: RE | Admit: 2021-06-21 | Payer: 59 | Source: Ambulatory Visit

## 2021-06-25 IMAGING — DX DG CHEST 2V
2 series · 2 of 2 positions shown · non-contrast
Comparison: None.

CLINICAL DATA: Preoperative assessment for bariatric surgery,
morbid obesity, hypertension

EXAM:
CHEST - 2 VIEW

[chest pa]
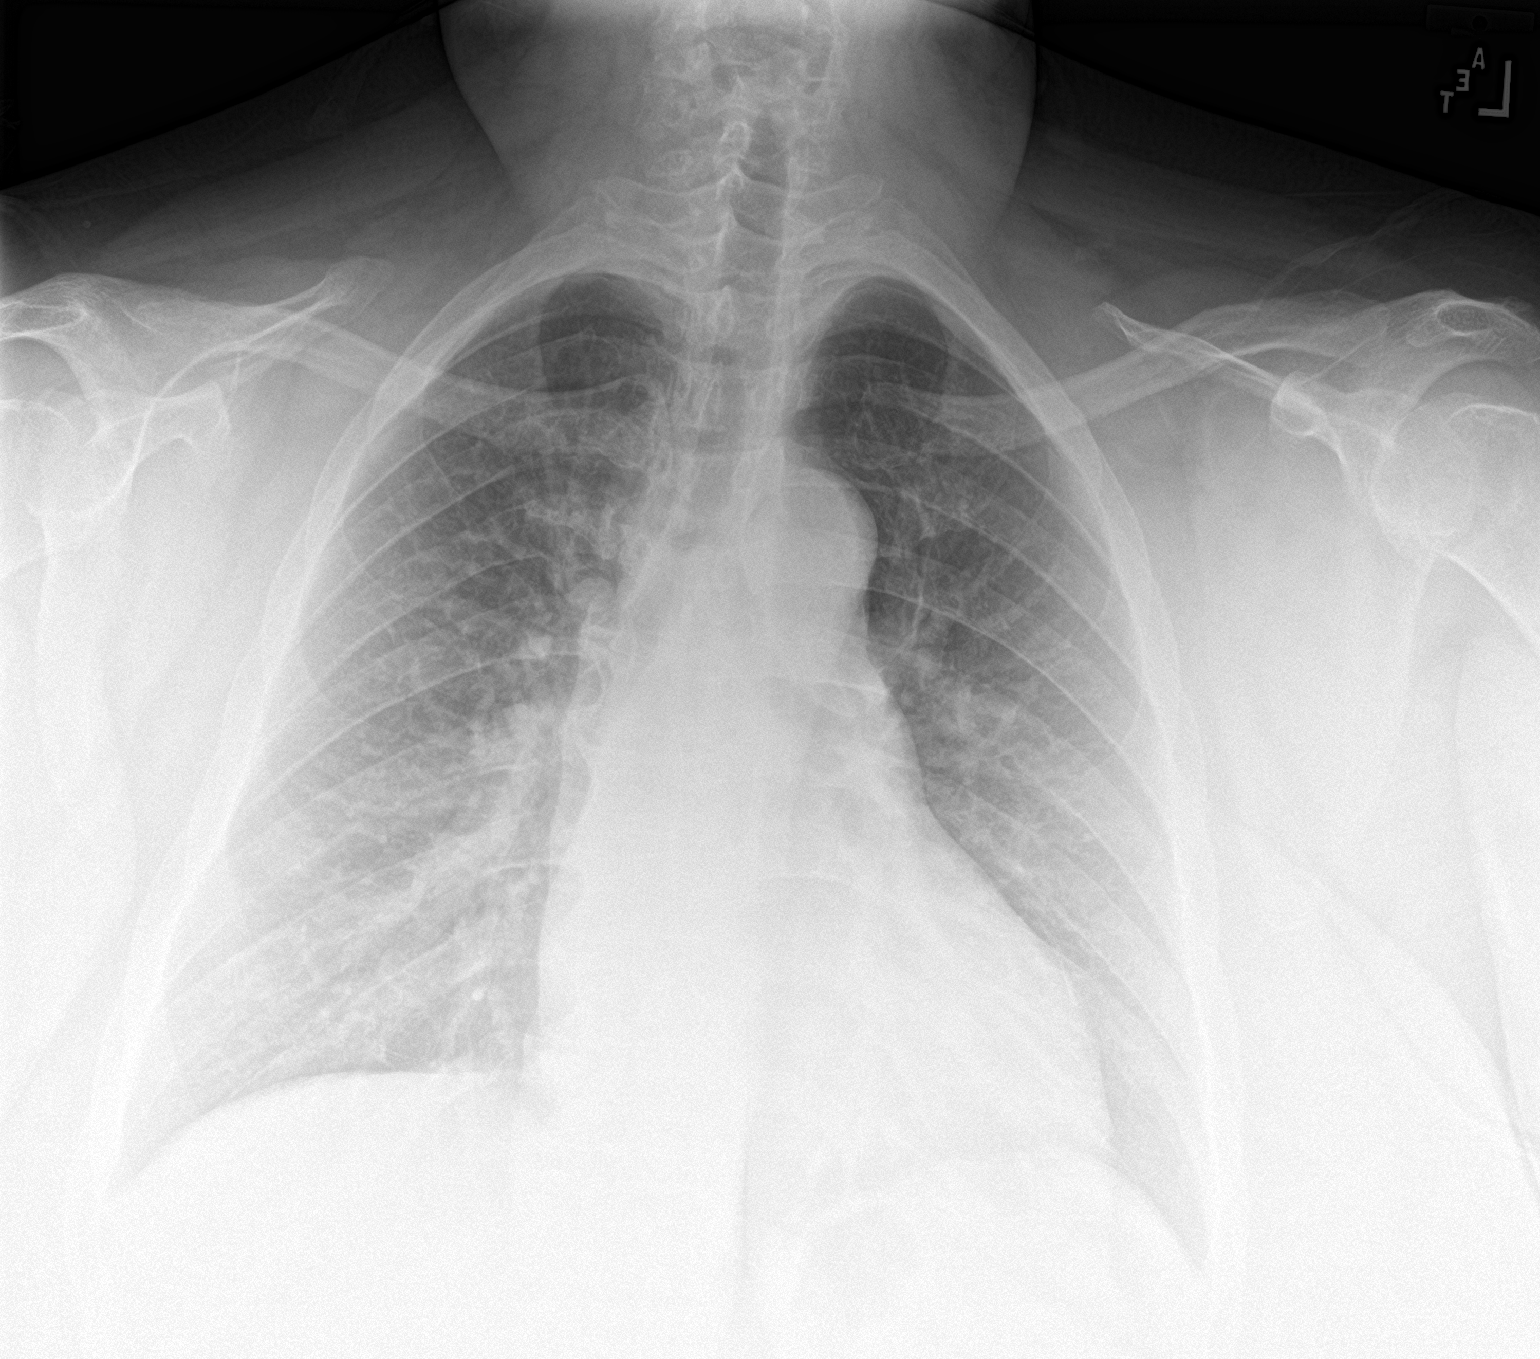

[chest lat]
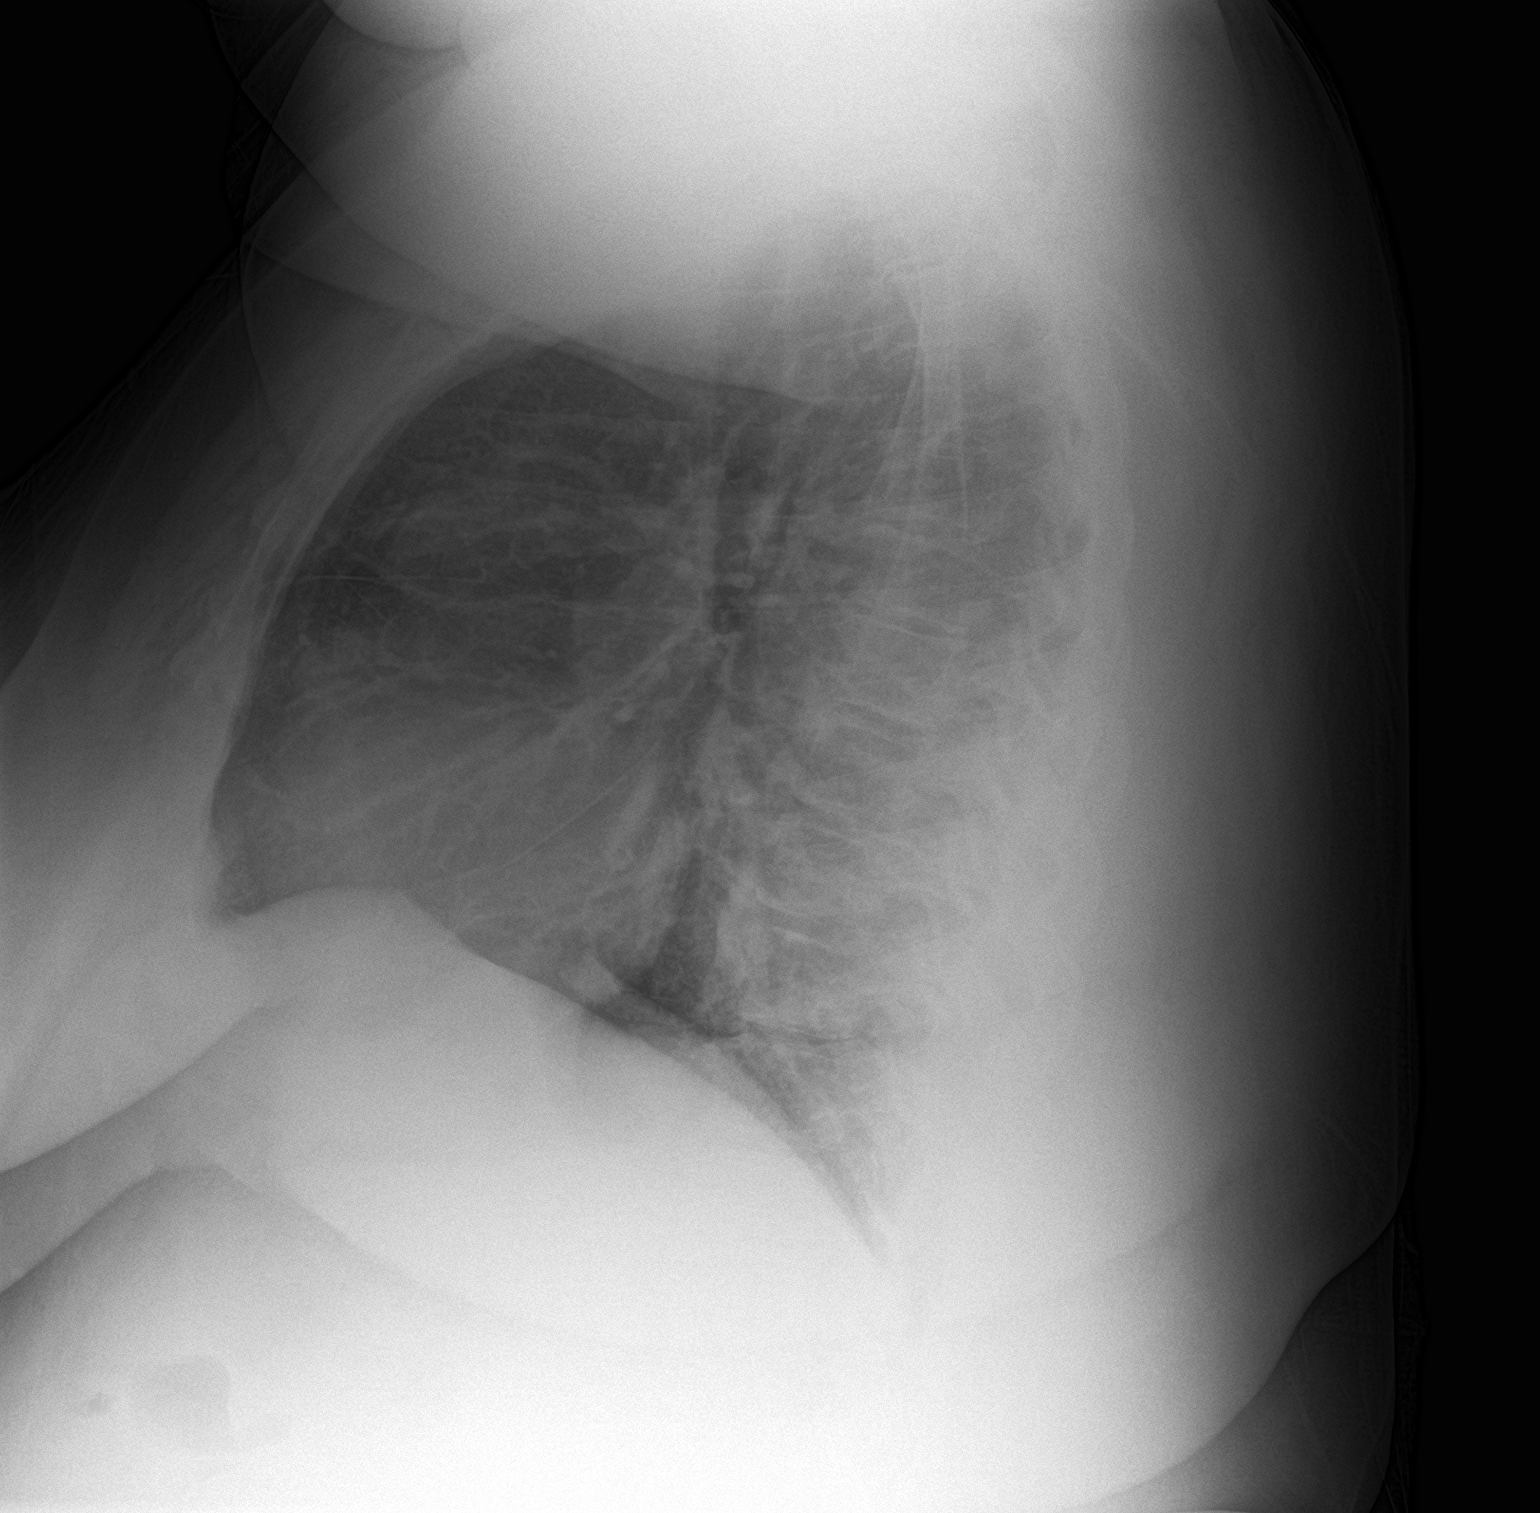

[2 of 2 positions shown; findings below may reference images not displayed]

FINDINGS: The heart size and mediastinal contours are within normal limits.
Both lungs are clear. The visualized skeletal structures are
unremarkable.
IMPRESSION: No active cardiopulmonary disease.

## 2021-07-04 ENCOUNTER — Telehealth: Payer: Self-pay

## 2021-07-04 NOTE — Telephone Encounter (Signed)
NOTES SCANNED TO REFERRAL 

## 2021-09-12 ENCOUNTER — Other Ambulatory Visit: Payer: Self-pay

## 2021-09-12 ENCOUNTER — Encounter: Payer: 59 | Attending: Surgery | Admitting: Skilled Nursing Facility1

## 2021-09-13 NOTE — Progress Notes (Signed)
Follow-up visit:  Post-Operative sleeve Surgery  Medical Nutrition Therapy:  Appt start time: 6:00pm end time:  7:00pm  Primary concerns today: Post-operative Bariatric Surgery Nutrition Management 6 Month Post-Op Class  Surgery date: 04/17/2021 Surgery type: sleeve Start weight at [facility]: 408 pounds Weight today: 330.1 pounds   Body Composition Scale 05/02/2021 06/14/2021 09/13/2021  Current Body Weight 376 365.2 330.1  Total Body Fat % 53.1 52.5 51  Visceral Fat 28 27 24   Fat-Free Mass % 46.8 47.4 48.9   Total Body Water % 37.9 38.2 38.9  Muscle-Mass lbs 33.3 33.4 32.7  BMI 62.2 59.7 54.6  Body Fat Displacement            Torso  lbs 124.2 119.2 104.6         Left Leg  lbs 24.8 23.8 20.9         Right Leg  lbs 24.8 23.8 20.9         Left Arm  lbs 12.4 11.9 10.4         Right Arm   lbs 12.4 11.9 10.4   Clinical  Medical hx: HTN, arthritis, diabetes Medications: see list; actos, metformin  Labs: HDL 29, A1C 8.1 Notable signs/symptoms: none stated Any previous deficiencies? No    Information Reviewed/ Discussed During Appointment: -Review of composition scale numbers -Fluid requirements (64-100 ounces) -Protein requirements (60-80g) -Strategies for tolerating diet -Advancement of diet to include Starchy vegetables -Barriers to inclusion of new foods -Inclusion of appropriate multivitamin and calcium supplements  -Exercise recommendations   Fluid intake: adequate   Medications: See List Supplementation: appropriate    Using straws: no Drinking while eating: no Having you been chewing well: yes Chewing/swallowing difficulties: no Changes in vision: no Changes to mood/headaches: no Hair loss/Cahnges to skin/Changes to nails: no Any difficulty focusing or concentrating: no Sweating: no Dizziness/Lightheaded: no Palpitations: no  Carbonated beverages: no N/V/D/C/GAS: no Abdominal Pain: no Dumping syndrome: no  Recent physical activity:  ADL's  Progress  Towards Goal(s):  In Progress Teaching method utilized: Visual & Auditory  Demonstrated degree of understanding via: Teach Back  Readiness Level: Action Barriers to learning/adherence to lifestyle change: none identified  Handouts given during visit include: Phase V diet Progression  Goals Sheet The Benefits of Exercise are endless..... Support Group Topics   Teaching Method Utilized:  Visual Auditory Hands on  Demonstrated degree of understanding via:  Teach Back   Monitoring/Evaluation:  Dietary intake, exercise, and body weight. Follow up in 3 months for 9 month post-op visit.

## 2021-12-12 ENCOUNTER — Encounter: Payer: 59 | Attending: Surgery | Admitting: Skilled Nursing Facility1

## 2021-12-12 ENCOUNTER — Encounter: Payer: Self-pay | Admitting: Skilled Nursing Facility1

## 2021-12-12 NOTE — Progress Notes (Signed)
Bariatric Nutrition Follow-Up Visit Medical Nutrition Therapy    NUTRITION ASSESSMENT    Surgery date: 04/17/2021 Surgery type: sleeve Start weight at [facility]: 408 pounds Weight today: 329.9 pounds   Body Composition Scale 05/02/2021 06/14/2021 09/13/2021  Current Body Weight 376 365.2 330.1  Total Body Fat % 53.1 52.5 51  Visceral Fat 28 27 24   Fat-Free Mass % 46.8 47.4 48.9   Total Body Water % 37.9 38.2 38.9  Muscle-Mass lbs 33.3 33.4 32.7  BMI 62.2 59.7 54.6  Body Fat Displacement            Torso  lbs 124.2 119.2 104.6         Left Leg  lbs 24.8 23.8 20.9         Right Leg  lbs 24.8 23.8 20.9         Left Arm  lbs 12.4 11.9 10.4         Right Arm   lbs 12.4 11.9 10.4   Clinical  Medical hx: HTN, arthritis, diabetes Medications: see list; actos, metformin  Labs: A1C 5.5 Notable signs/symptoms: none stated Any previous deficiencies? No   Lifestyle & Dietary Hx  Pt states she started taking iron pills. Pt states her A1C is 5.5 Pt states she has been drinking water, stating she feels that sugar free drinks give her a headache. Pt states she eats spinach, brussell sprouts, cabbage Pt states she has been tired and lathargic  Pt states she does not sleep well at night some nights. Pt states she can overdue the nuts. Pt states she is not tolerate the ground beef very well.   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 and water aerobics 3 days a week.  24-Hr Dietary Recall: First Meal: a pancake, and 2 sausage links Snack:   Second Meal: mini shrimp egg rolls Snack: PB and crackers (ritz) Third Meal: hamburger patty with tomatoes and pickles.  Not tolerating the ground beef well Snack: PB and crackers and mixed nuts Beverages: water + flavoring, water  Post-Op Goals/ Signs/ Symptoms Using straws: no Drinking while eating:  no Chewing/swallowing difficulties: no Changes in vision: no Changes to mood/headaches: no Hair loss/changes to skin/nails: no Difficulty focusing/concentrating: no Sweating: no Limb weakness: no Dizziness/lightheadedness: no Palpitations: no  Carbonated/caffeinated beverages: no N/V/D/C/Gas: no Abdominal pain: no Dumping syndrome: no    NUTRITION DIAGNOSIS  Overweight/obesity ([place]-3.3) related to past poor dietary habits and physical inactivity as evidenced by completed bariatric surgery and following dietary guidelines for continued weight loss and healthy nutrition status.     NUTRITION INTERVENTION Nutrition counseling (C-1) and education (E-2) to facilitate bariatric surgery goals, including: Diet advancement to the next phase (phase 6) now including fruits The importance of consuming adequate calories as well as certain nutrients daily due to the body's need for essential vitamins, minerals, and fats The importance of daily physical activity and to reach a goal of at least 150 minutes of moderate to vigorous physical activity weekly (or as directed by their physician) due to benefits such as increased musculature and improved lab values The importance of intuitive eating specifically learning hunger-satiety cues and understanding the importance of learning a new body: The importance of mindful eating to avoid grazing behaviors    Goals: -Continue to aim for a minimum of 64 fluid ounces 7 days a week with at least 30 ounces being plain  water  -Eat non-starchy vegetables 2 times a day 7 days a week  -Start out with soft cooked vegetables today and tomorrow; if tolerated begin to eat raw vegetables or cooked including salads  -Eat your 3 ounces of protein first then start in on your non-starchy vegetables; once you understand how much of your meal leads to satisfaction and not full while still eating 3 ounces of protein and non-starchy vegetables you can eat them in any order    -Continue to aim for 30 minutes of activity at least 5 times a week  -Do NOT cook with/add to your food: alfredo sauce, cheese sauce, barbeque sauce, ketchup, fat back, butter, bacon grease, grease, Crisco, OR SUGAR    Handouts Provided Include  Phase 6 Food Plan Bariatric MyPlate  Learning Style & Readiness for Change Teaching method utilized: Visual & Auditory  Demonstrated degree of understanding via: Teach Back  Readiness Level: Action Barriers to learning/adherence to lifestyle change:   RD's Notes for Next Visit Assess adherence to pt chosen goals    MONITORING & EVALUATION Dietary intake, weekly physical activity, body weight  Next Steps Patient is to follow-up in Sept for 1 year follow-up

## 2022-01-03 ENCOUNTER — Telehealth: Payer: Self-pay | Admitting: Orthopaedic Surgery

## 2022-01-03 ENCOUNTER — Ambulatory Visit (INDEPENDENT_AMBULATORY_CARE_PROVIDER_SITE_OTHER): Payer: 59

## 2022-01-03 ENCOUNTER — Ambulatory Visit: Payer: 59 | Admitting: Orthopaedic Surgery

## 2022-01-03 VITALS — BP 143/86 | HR 82 | Ht 65.0 in | Wt 317.0 lb

## 2022-01-03 DIAGNOSIS — G8929 Other chronic pain: Secondary | ICD-10-CM

## 2022-01-03 DIAGNOSIS — M5441 Lumbago with sciatica, right side: Secondary | ICD-10-CM | POA: Diagnosis not present

## 2022-01-03 DIAGNOSIS — M25562 Pain in left knee: Secondary | ICD-10-CM

## 2022-01-03 DIAGNOSIS — M7061 Trochanteric bursitis, right hip: Secondary | ICD-10-CM

## 2022-01-03 DIAGNOSIS — M25561 Pain in right knee: Secondary | ICD-10-CM

## 2022-01-03 DIAGNOSIS — M170 Bilateral primary osteoarthritis of knee: Secondary | ICD-10-CM

## 2022-01-03 NOTE — Telephone Encounter (Signed)
Please send in Pain Medication to [facility] on  [address]

## 2022-01-03 NOTE — Progress Notes (Signed)
Office Visit Note   Patient: Maureen Flores           Date of Birth: 10-16-1970           MRN: [id number] Visit Date: 01/03/2022              Requested by: Angelica Chessman, MD [id number] [place],  [place] [address] PCP: Angelica Chessman, MD   Assessment & Plan: Visit Diagnoses:  1. Chronic pain of both knees   2. Chronic right-sided low back pain with right-sided sciatica   3. Trochanteric bursitis, right hip   4. Morbid obesity (HCC)   5. Bilateral primary osteoarthritis of knee     Plan: Trochanteric injection performed right stroke.  She had improvement in her symptoms.  We discussed workout activity modifications since some of the work with a Systems analyst is both aggravating her severe knee osteoarthritis with varus and also with trochanteric bursitis.  Congratulated on her weight loss we discussed activity modification and not overdo it when she works out.  When she reaches target weight she will need right total knee arthroplasty for progressive varus.  Recheck 3 months.   Follow-Up Instructions: Return in about 3 months (around 04/05/2022).   Orders:  Orders Placed This Encounter  Procedures   Large Joint Inj   XR Knee 1-2 Views Right   XR Knee 1-2 Views Left   XR Lumbar Spine 2-3 Views   No orders of the defined types were placed in this encounter.     Procedures: Large Joint Inj: R greater trochanter on 01/03/2022 10:34 AM Details: lateral approach Medications: 1 mL lidocaine 1 %; 3 mL bupivacaine 0.25 %; 40 mg methylPREDNISolone acetate 40 MG/ML      Clinical Data: No additional findings.   Subjective:   HPI 51 year old female with weight loss surgery September 2022 with gastric laparoscopic sleeve.  She lost 100 pounds continues to have back pain pain radiates into her hips and she has been working out she tries to continue to lose weight.  She has a Systems analyst who is doing some squats.  She has had pain laterally over the  trochanter also pain in the right knee with 15 to 20 degrees clinical varus deformity and crepitus with knee range of motion of both knees.  She has had chronic back pain symptoms. Current BMI is 52. Review of Systems all other systems noncontributory to HPI.   Objective: Vital Signs: BP (!) 143/86   Pulse 82   Ht 5\' 5"  (1.651 m)   Wt (!) 317 lb (143.8 kg)   BMI 52.75 kg/m   Physical Exam Constitutional:      Appearance: She is well-developed.  HENT:     Head: Normocephalic.     Right Ear: External ear normal.     Left Ear: External ear normal. There is no impacted cerumen.  Eyes:     Pupils: Pupils are equal, round, and reactive to light.  Neck:     Thyroid: No thyromegaly.     Trachea: No tracheal deviation.  Cardiovascular:     Rate and Rhythm: Normal rate.  Pulmonary:     Effort: Pulmonary effort is normal.  Abdominal:     Palpations: Abdomen is soft.     Comments: Liver and spleen not palpable.  Musculoskeletal:     Cervical back: No rigidity.  Skin:    General: Skin is warm and dry.  Neurological:     Mental Status: She  is alert and oriented to person, place, and time.  Psychiatric:        Behavior: Behavior normal.     Ortho Exam crepitus with right and left knee range of motion.  Visit tenderness over trochanteric bursa negative straight leg raising 90 degrees.  Negative logroll hips.  Normal capillary refill no plantar foot lesions.  Specialty Comments:  No specialty comments available.  Imaging: Standing AP both knees lateral left knee obtained with sunrise patella.  There is loss of joint space subchondral sclerosis marginal osteophytes left knee without significant varus which is seen in the right knee.  Impression: Left knee moderate to severe osteoarthritis Standing AP both knees lateral right knee sunrise patella x-ray obtained  and reviewed.  This shows severe knee osteoarthritis right knee with 20  degrees varus and medial compartment erosive  changes.  Tricompartmental  degenerative changes are seen.   Impression: Tricompartmental degenerative severe right knee arthritis with  varus. AP lateral lumbar spine images are obtained and reviewed.  Quality is  decreased by body habitus.  No spondylolisthesis.  Some disc space  narrowing at L3-4 and L4-5.   Impression: Lumbar spine negative for acute changes.  Disc base narrowing  noted mid lumbar as described above. PMFS History: Patient Active Problem List   Diagnosis Date Noted   Bilateral primary osteoarthritis of knee 01/07/2022   Trochanteric bursitis, right hip 01/07/2022   Morbid obesity (HCC) 04/17/2021   Past Medical History:  Diagnosis Date   Asthma    Diabetes mellitus without complication (HCC)    type 2   Headache    Hypertension    Obesity    Osteoarthritis     No family history on file.  Past Surgical History:  Procedure Laterality Date   CESAREAN SECTION     LAPAROSCOPIC GASTRIC SLEEVE RESECTION N/A 04/17/2021   Procedure: LAPAROSCOPIC GASTRIC SLEEVE RESECTION;  Surgeon: Berna Bue, MD;  Location: [facility];  Service: General;  Laterality: N/A;   UPPER GI ENDOSCOPY N/A 04/17/2021   Procedure: UPPER GI ENDOSCOPY;  Surgeon: Berna Bue, MD;  Location: [facility];  Service: General;  Laterality: N/A;   WISDOM TOOTH EXTRACTION     Social History   Occupational History   Not on file  Tobacco Use   Smoking status: Never   Smokeless tobacco: Never  Vaping Use   Vaping Use: Never used  Substance and Sexual Activity   Alcohol use: Yes    Comment: social wine   Drug use: No   Sexual activity: Not on file

## 2022-01-04 ENCOUNTER — Encounter: Payer: Self-pay | Admitting: Orthopaedic Surgery

## 2022-01-04 NOTE — Telephone Encounter (Signed)
Please advise 

## 2022-01-05 ENCOUNTER — Other Ambulatory Visit: Payer: Self-pay | Admitting: Surgical

## 2022-01-05 MED ORDER — TRAMADOL HCL 50 MG PO TABS: 50.0000 mg | ORAL_TABLET | Freq: Two times a day (BID) | ORAL | 0 refills | Status: DC | PRN

## 2022-01-05 NOTE — Telephone Encounter (Signed)
Sent in RX for tramadol

## 2022-01-07 ENCOUNTER — Other Ambulatory Visit: Payer: Self-pay | Admitting: Orthopaedic Surgery

## 2022-01-07 DIAGNOSIS — M7061 Trochanteric bursitis, right hip: Secondary | ICD-10-CM | POA: Insufficient documentation

## 2022-01-07 DIAGNOSIS — M170 Bilateral primary osteoarthritis of knee: Secondary | ICD-10-CM | POA: Insufficient documentation

## 2022-01-07 MED ORDER — LIDOCAINE HCL 1 % IJ SOLN
1.0000 mL | INTRAMUSCULAR | Status: AC | PRN
  Administered 2022-01-03: 1 mL

## 2022-01-07 MED ORDER — METHYLPREDNISOLONE ACETATE 40 MG/ML IJ SUSP
40.0000 mg | INTRAMUSCULAR | Status: AC | PRN
  Administered 2022-01-03: 40 mg via INTRA_ARTICULAR

## 2022-01-07 MED ORDER — BUPIVACAINE HCL 0.25 % IJ SOLN
3.0000 mL | INTRAMUSCULAR | Status: AC | PRN
  Administered 2022-01-03: 3 mL via INTRA_ARTICULAR

## 2022-01-07 NOTE — Progress Notes (Unsigned)
   Office Visit Note   Patient: Maureen Flores           Date of Birth: 12/18/1970           MRN: [id number] Visit Date: 01/07/2022              Requested by: No referring provider defined for this encounter. PCP: Angelica Chessman, MD   Assessment & Plan: Visit Diagnoses: No diagnosis found.  Plan: ***  Follow-Up Instructions: No follow-ups on file.   Orders:  No orders of the defined types were placed in this encounter.  No orders of the defined types were placed in this encounter.     Procedures: No procedures performed   Clinical Data: No additional findings.   Subjective: No chief complaint on file.   HPI  Review of Systems   Objective: Vital Signs: There were no vitals taken for this visit.  Physical Exam  Ortho Exam  Specialty Comments:  No specialty comments available.  Imaging: No results found.   PMFS History: Patient Active Problem List   Diagnosis Date Noted   Morbid obesity (HCC) 04/17/2021   Past Medical History:  Diagnosis Date   Asthma    Diabetes mellitus without complication (HCC)    type 2   Headache    Hypertension    Obesity    Osteoarthritis     No family history on file.  Past Surgical History:  Procedure Laterality Date   CESAREAN SECTION     LAPAROSCOPIC GASTRIC SLEEVE RESECTION N/A 04/17/2021   Procedure: LAPAROSCOPIC GASTRIC SLEEVE RESECTION;  Surgeon: Berna Bue, MD;  Location: [facility];  Service: General;  Laterality: N/A;   UPPER GI ENDOSCOPY N/A 04/17/2021   Procedure: UPPER GI ENDOSCOPY;  Surgeon: Berna Bue, MD;  Location: [facility];  Service: General;  Laterality: N/A;   WISDOM TOOTH EXTRACTION     Social History   Occupational History   Not on file  Tobacco Use   Smoking status: Never   Smokeless tobacco: Never  Vaping Use   Vaping Use: Never used  Substance and Sexual Activity   Alcohol use: Yes    Comment: social wine   Drug use: No   Sexual activity: Not on file

## 2022-02-16 ENCOUNTER — Telehealth: Payer: Self-pay | Admitting: *Deleted

## 2022-02-16 NOTE — Telephone Encounter (Signed)
Dr. Jacklynn Lewis pt is scheduled for a direct colon with you on 03/30/22. Pt has a BMI of 52.75. Would you like an OV or direct admit to [facility]?  Thank you,  Misty Stanley PV

## 2022-02-16 NOTE — Telephone Encounter (Signed)
Lets do OV first RG 

## 2022-02-19 NOTE — Telephone Encounter (Signed)
Called pt. No answer. Left voicemail instructing pt to call back regarding upcoming procedure and PV. Pt will need an OV prior to any procedure, BMI is 52.75. PV and colon cancelled per protocol.

## 2022-03-13 ENCOUNTER — Encounter: Payer: Self-pay | Admitting: Orthopaedic Surgery

## 2022-03-21 ENCOUNTER — Encounter: Payer: Self-pay | Admitting: Orthopaedic Surgery

## 2022-03-21 ENCOUNTER — Ambulatory Visit: Payer: 59 | Admitting: Orthopaedic Surgery

## 2022-03-21 VITALS — BP 146/83 | HR 83 | Ht 65.0 in | Wt 310.0 lb

## 2022-03-21 DIAGNOSIS — M1712 Unilateral primary osteoarthritis, left knee: Secondary | ICD-10-CM | POA: Diagnosis not present

## 2022-03-21 DIAGNOSIS — M1711 Unilateral primary osteoarthritis, right knee: Secondary | ICD-10-CM | POA: Diagnosis not present

## 2022-03-21 DIAGNOSIS — M170 Bilateral primary osteoarthritis of knee: Secondary | ICD-10-CM | POA: Diagnosis not present

## 2022-03-21 MED ORDER — BUPIVACAINE HCL 0.5 % IJ SOLN
3.0000 mL | INTRAMUSCULAR | Status: AC | PRN
  Administered 2022-03-21: 3 mL via INTRA_ARTICULAR

## 2022-03-21 MED ORDER — METHYLPREDNISOLONE ACETATE 40 MG/ML IJ SUSP
40.0000 mg | INTRAMUSCULAR | Status: AC | PRN
  Administered 2022-03-21: 40 mg via INTRA_ARTICULAR

## 2022-03-21 MED ORDER — LIDOCAINE HCL 1 % IJ SOLN
0.5000 mL | INTRAMUSCULAR | Status: AC | PRN
  Administered 2022-03-21: .5 mL

## 2022-03-21 MED ORDER — BUPIVACAINE HCL 0.25 % IJ SOLN
4.0000 mL | INTRAMUSCULAR | Status: AC | PRN
  Administered 2022-03-21: 4 mL via INTRA_ARTICULAR

## 2022-03-21 NOTE — Progress Notes (Signed)
Office Visit Note   Patient: Maureen Flores           Date of Birth: May 20, 1971           MRN: [id number] Visit Date: 03/21/2022              Requested by: Angelica Chessman, MD [address] [place],  [place] [address] PCP: Angelica Chessman, MD   Assessment & Plan: Visit Diagnoses:  1. Bilateral primary osteoarthritis of knee     Plan: Bilateral knee cortisone injections performed which she tolerated well.  Should continue to work on weight loss and will call when she gets close to her goal so we can schedule her for right total knee arthroplasty.  Follow-Up Instructions: No follow-ups on file.   Orders:  Orders Placed This Encounter  Procedures   Large Joint Inj: R knee   Large Joint Inj: L knee   No orders of the defined types were placed in this encounter.     Procedures: Large Joint Inj: R knee on 03/21/2022 11:08 AM Indications: pain and joint swelling Details: 22 G 1.5 in needle, anterolateral approach  Arthrogram: No  Medications: 40 mg methylPREDNISolone acetate 40 MG/ML; 0.5 mL lidocaine 1 %; 4 mL bupivacaine 0.25 % Outcome: tolerated well, no immediate complications Procedure, treatment alternatives, risks and benefits explained, specific risks discussed. Consent was given by the patient. Immediately prior to procedure a time out was called to verify the correct patient, procedure, equipment, support staff and site/side marked as required. Patient was prepped and draped in the usual sterile fashion.    Large Joint Inj: L knee on 03/21/2022 11:08 AM Indications: joint swelling and pain Details: 22 G 1.5 in needle, anterolateral approach  Arthrogram: No  Medications: 0.5 mL lidocaine 1 %; 3 mL bupivacaine 0.5 %; 40 mg methylPREDNISolone acetate 40 MG/ML Outcome: tolerated well, no immediate complications Procedure, treatment alternatives, risks and benefits explained, specific risks discussed. Consent was given by the patient. Immediately prior to  procedure a time out was called to verify the correct patient, procedure, equipment, support staff and site/side marked as required. Patient was prepped and draped in the usual sterile fashion.       Clinical Data: No additional findings.   Subjective: Chief Complaint  Patient presents with   Right Knee - Pain   Left Knee - Pain   Lower Back - Pain    HPI 51 year old female returns with ongoing problems with some trochanteric bursitis improved since injection 01/03/2022.  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  Her goal is to 40 so her BMI would be below 40 so she can proceed with knee arthroplasties right first and then later second.  Today she is requesting knee injections.  She is currently taking gabapentin and duloxetine.  She has an upcoming visit with her daughter next week and will be doing some walking.  Previously she is gotten a few months relief with the injections.  Review of Systems positive for BMI greater than 50 foster weight loss post gastric sleeve.  Bilateral knee osteoarthritis.  All other systems noncontributory HPI.   Objective: Vital Signs: BP (!) 146/83   Pulse 83   Ht 5\' 5"  (1.651 m)   Wt (!) 310 lb (140.6 kg)   BMI 51.59 kg/m   Physical Exam Constitutional:      Appearance: She is well-developed.  HENT:     Head: Normocephalic.     Right Ear: External ear normal.     Left Ear: External ear normal. There is no impacted cerumen.  Eyes:     Pupils: Pupils are equal, round, and reactive to light.  Neck:     Thyroid: No thyromegaly.     Trachea: No tracheal deviation.  Cardiovascular:     Rate and Rhythm: Normal rate.  Pulmonary:     Effort: Pulmonary effort is normal.  Abdominal:     Palpations: Abdomen is soft.  Musculoskeletal:     Cervical back: No rigidity.  Skin:    General: Skin is warm and dry.  Neurological:     Mental Status: She is alert  and oriented to person, place, and time.  Psychiatric:        Behavior: Behavior normal.     Ortho Exam bilateral knee crepitus.  Clinical 20 degrees varus right knee with bone-on-bone medially and some MCL laxity.  ACL PCL is normal.  Distal pulses are intact.  Negative logroll the hips.  Specialty Comments:  No specialty comments available.  Imaging: No results found.   PMFS History: Patient Active Problem List   Diagnosis Date Noted   Bilateral primary osteoarthritis of knee 01/07/2022   Trochanteric bursitis, right hip 01/07/2022   Morbid obesity (HCC) 04/17/2021   Past Medical History:  Diagnosis Date   Asthma    Diabetes mellitus without complication (HCC)    type 2   Headache    Hypertension    Obesity    Osteoarthritis     No family history on file.  Past Surgical History:  Procedure Laterality Date   CESAREAN SECTION     LAPAROSCOPIC GASTRIC SLEEVE RESECTION N/A 04/17/2021   Procedure: LAPAROSCOPIC GASTRIC SLEEVE RESECTION;  Surgeon: Berna Bue, MD;  Location: [facility];  Service: General;  Laterality: N/A;   UPPER GI ENDOSCOPY N/A 04/17/2021   Procedure: UPPER GI ENDOSCOPY;  Surgeon: Berna Bue, MD;  Location: [facility];  Service: General;  Laterality: N/A;   WISDOM TOOTH EXTRACTION     Social History   Occupational History   Not on file  Tobacco Use   Smoking status: Never   Smokeless tobacco: Never  Vaping Use   Vaping Use: Never used  Substance and Sexual Activity   Alcohol use: Yes    Comment: social wine   Drug use: No   Sexual activity: Not on file

## 2022-03-30 ENCOUNTER — Encounter: Payer: 59 | Admitting: Gastroenterology

## 2022-04-04 ENCOUNTER — Ambulatory Visit: Payer: 59 | Admitting: Orthopaedic Surgery

## 2022-09-13 ENCOUNTER — Encounter (INDEPENDENT_AMBULATORY_CARE_PROVIDER_SITE_OTHER): Payer: Self-pay | Admitting: Internal Medicine

## 2022-09-13 ENCOUNTER — Ambulatory Visit (INDEPENDENT_AMBULATORY_CARE_PROVIDER_SITE_OTHER): Payer: 59 | Admitting: Internal Medicine

## 2022-09-13 VITALS — BP 149/86 | HR 73 | Temp 98.2°F | Ht 64.0 in | Wt 305.0 lb

## 2022-09-13 DIAGNOSIS — E7800 Pure hypercholesterolemia, unspecified: Secondary | ICD-10-CM | POA: Diagnosis not present

## 2022-09-13 DIAGNOSIS — R7303 Prediabetes: Secondary | ICD-10-CM | POA: Insufficient documentation

## 2022-09-13 DIAGNOSIS — Z903 Acquired absence of stomach [part of]: Secondary | ICD-10-CM

## 2022-09-13 DIAGNOSIS — I10 Essential (primary) hypertension: Secondary | ICD-10-CM | POA: Diagnosis not present

## 2022-09-13 DIAGNOSIS — Z6841 Body Mass Index (BMI) 40.0 and over, adult: Secondary | ICD-10-CM

## 2022-09-13 NOTE — Assessment & Plan Note (Signed)
I do not have a recent lipid panel.  She is currently on simvastatin 10 mg a day without any side effects.  We will check fasting lipid profile with intake labs

## 2022-09-13 NOTE — Progress Notes (Signed)
Office: [phone]  /  Fax: [phone]   Initial Visit  Maureen Flores was seen in clinic today to evaluate for obesity. She is interested in losing weight to improve overall health and reduce the risk of weight related complications. She presents today to review program treatment options, initial physical assessment, and evaluation.  He has a history of gastric sleeve surgery with a peak weight of 435 and a nadir of 301 under good job maintaining weight loss but has plateaued.  She was referred by: Specialist bariatric surgeon  When asked what else they would like to accomplish? She states: Improve existing medical conditions, Reduce risk for a surgery, and Improve quality of life  When asked how has your weight affected you? She states: Contributed to medical problems, Contributed to orthopedic problems or mobility issues, Having fatigue, and Having poor endurance  Some associated conditions: Hypertension, Hyperlipidemia, and Prediabetes  Contributing factors: Family history, Medications, Reduced physical activity, and Pregnancy  Weight promoting medications identified: Psychotropic medications and Antiepileptics  Current nutrition plan: Other: small meals and protein shakes  Current level of physical activity: Limited due to chronic pain or orthopedic problems  Current or previous pharmacotherapy: GLP-1  Response to medication: Other: had been on Saxenda, lost 75 but regained weight.   Past medical history includes:   Past Medical History:  Diagnosis Date   Asthma    Diabetes mellitus without complication ([place])    type 2   Headache    Hypertension    Obesity    Osteoarthritis      Objective:   BP (!) 149/86   Pulse 73   Temp 98.2 F (36.8 C)   Ht 5' 4"$  (1.626 m)   Wt (!) 305 lb (138.3 kg)   SpO2 97%   BMI 52.35 kg/m  She was weighed on the bioimpedance scale: Body mass index is 52.35 kg/m.  Peak Weight:435 , Body Fat%:64, Visceral Fat Rating:25, Weight  trend over the last 12 months: Increasing  General:  Alert, oriented and cooperative. Patient is in no acute distress.  Respiratory: Normal respiratory effort, no problems with respiration noted  Extremities: Normal range of motion.    Mental Status: Normal mood and affect. Normal behavior. Normal judgment and thought content.   DIAGNOSTIC DATA REVIEWED:  BMET    Component Value Date/Time   NA 139 04/18/2021 0710   K 3.6 04/18/2021 0710   CL 103 04/18/2021 0710   CO2 23 04/18/2021 0710   GLUCOSE 138 (H) 04/18/2021 0710   BUN 24 (H) 04/18/2021 0710   CREATININE 0.97 04/18/2021 0710   CALCIUM 9.0 04/18/2021 0710   GFRNONAA >60 04/18/2021 0710   GFRAA >60 06/17/2017 1445   Lab Results  Component Value Date   HGBA1C 6.5 (H) 03/28/2021   No results found for: "INSULIN" CBC    Component Value Date/Time   WBC 13.0 (H) 04/18/2021 0442   RBC 4.79 04/18/2021 0442   HGB 13.4 04/18/2021 0442   HCT 41.8 04/18/2021 0442   PLT 334 04/18/2021 0442   MCV 87.3 04/18/2021 0442   MCH 28.0 04/18/2021 0442   MCHC 32.1 04/18/2021 0442   RDW 14.6 04/18/2021 0442   Iron/TIBC/Ferritin/ %Sat No results found for: "IRON", "TIBC", "FERRITIN", "IRONPCTSAT" Lipid Panel  No results found for: "CHOL", "TRIG", "HDL", "CHOLHDL", "VLDL", "LDLCALC", "LDLDIRECT" Hepatic Function Panel     Component Value Date/Time   PROT 7.1 04/18/2021 0710   ALBUMIN 3.4 (L) 04/18/2021 0710   AST 24 04/18/2021 0710  ALT 26 04/18/2021 0710   ALKPHOS 71 04/18/2021 0710   BILITOT 0.6 04/18/2021 0710   No results found for: "TSH"   Assessment and Plan:   H/O gastric sleeve  Class 3 severe obesity with serious comorbidity and body mass index (BMI) of 50.0 to 59.9 in adult, unspecified obesity type ([place]) Assessment & Plan: She has a history of gastric sleeve and has not a good job maintaining her weight but has plateaued secondary to metabolic adaptations.  She is also not getting enough calories throughout her  day.  Insert biometrics   Prediabetes Assessment & Plan: I reviewed most recent A1c was 6.5 which is in the diabetes range.  I suspect he might of had a history of diabetes instead of prediabetes.  I will be checking a fasting blood sugar and hemoglobin A1c with intake labs.   Primary hypertension Assessment & Plan: Blood pressure is above target.  Most recent renal parameters 08/18/2020 shows normal GFR low normal potassium.  She is currently on amlodipine, carvedilol, losartan.  We will assess adherence to regimen.  I also recommend monitoring blood pressure at home in the morning and also before bedtime.  We will also repeat renal parameters with intake.   Pure hypercholesterolemia Assessment & Plan: I do not have a recent lipid panel.  She is currently on simvastatin 10 mg a day without any side effects.  We will check fasting lipid profile with intake labs         Obesity Treatment / Action Plan:  Patient will work on garnering support from family and friends to begin weight loss journey. Will work on eliminating or reducing the presence of highly palatable, calorie dense foods in the home. Will complete provided nutritional and psychosocial assessment questionnaire before the next appointment. Will be scheduled for indirect calorimetry to determine resting energy expenditure in a fasting state.  This will allow [place] to create a reduced calorie, high-protein meal plan to promote loss of fat mass while preserving muscle mass. Counseled on the health benefits of losing 5%-15% of total body weight. Was counseled on nutritional approaches to weight loss and benefits of complex carbs and high quality protein as part of nutritional weight management. Was counseled on pharmacotherapy and role as an adjunct in weight management.   Obesity Education Performed Today:  She was weighed on the bioimpedance scale and results were discussed and documented in the synopsis.  We discussed obesity  as a disease and the importance of a more detailed evaluation of all the factors contributing to the disease.  We discussed the importance of long term lifestyle changes which include nutrition, exercise and behavioral modifications as well as the importance of customizing this to her specific health and social needs.  We discussed the benefits of reaching a healthier weight to alleviate the symptoms of existing conditions and reduce the risks of the biomechanical, metabolic and psychological effects of obesity.  Jazzlin Galloway appears to be in the action stage of change and states they are ready to start intensive lifestyle modifications and behavioral modifications.  30 minutes was spent today on this visit including the above counseling, pre-visit chart review, and post-visit documentation.  Reviewed by clinician on day of visit: allergies, medications, problem list, medical history, surgical history, family history, social history, and previous encounter notes.    I have reviewed the above documentation for accuracy and completeness, and I agree with the above.  Thomes Dinning, MD

## 2022-09-13 NOTE — Assessment & Plan Note (Signed)
She has a history of gastric sleeve and has not a good job maintaining her weight but has plateaued secondary to metabolic adaptations.  She is also not getting enough calories throughout her day.  Insert biometrics

## 2022-09-13 NOTE — Assessment & Plan Note (Addendum)
I reviewed most recent A1c was 6.5 which is in the diabetes range.  I suspect he might of had a history of diabetes instead of prediabetes.  I will be checking a fasting blood sugar and hemoglobin A1c with intake labs.

## 2022-09-13 NOTE — Assessment & Plan Note (Signed)
Blood pressure is above target.  Most recent renal parameters 08/18/2020 shows normal GFR low normal potassium.  She is currently on amlodipine, carvedilol, losartan.  We will assess adherence to regimen.  I also recommend monitoring blood pressure at home in the morning and also before bedtime.  We will also repeat renal parameters with intake.

## 2022-11-07 ENCOUNTER — Other Ambulatory Visit (HOSPITAL_COMMUNITY): Payer: Self-pay | Admitting: Surgery

## 2022-11-07 DIAGNOSIS — K219 Gastro-esophageal reflux disease without esophagitis: Secondary | ICD-10-CM

## 2022-11-07 DIAGNOSIS — Z903 Acquired absence of stomach [part of]: Secondary | ICD-10-CM

## 2022-11-13 ENCOUNTER — Other Ambulatory Visit: Payer: Self-pay | Admitting: Family Medicine

## 2022-11-13 DIAGNOSIS — Z1239 Encounter for other screening for malignant neoplasm of breast: Secondary | ICD-10-CM

## 2022-11-15 ENCOUNTER — Other Ambulatory Visit (HOSPITAL_COMMUNITY): Payer: Self-pay | Admitting: Surgery

## 2022-11-15 ENCOUNTER — Ambulatory Visit (HOSPITAL_COMMUNITY)
Admission: RE | Admit: 2022-11-15 | Discharge: 2022-11-15 | Disposition: A | Discharge status: Home / Self Care | Payer: 59 | Source: Ambulatory Visit | Attending: Surgery | Admitting: Surgery

## 2022-11-15 DIAGNOSIS — K219 Gastro-esophageal reflux disease without esophagitis: Secondary | ICD-10-CM

## 2022-11-15 DIAGNOSIS — Z903 Acquired absence of stomach [part of]: Secondary | ICD-10-CM | POA: Diagnosis present

## 2022-11-20 ENCOUNTER — Ambulatory Visit: Payer: 59 | Admitting: Orthopaedic Surgery

## 2022-11-20 ENCOUNTER — Encounter: Payer: Self-pay | Admitting: Orthopaedic Surgery

## 2022-11-20 VITALS — Ht 65.0 in | Wt 312.0 lb

## 2022-11-20 DIAGNOSIS — M170 Bilateral primary osteoarthritis of knee: Secondary | ICD-10-CM

## 2022-11-22 DIAGNOSIS — M170 Bilateral primary osteoarthritis of knee: Secondary | ICD-10-CM

## 2022-11-22 DIAGNOSIS — M1711 Unilateral primary osteoarthritis, right knee: Secondary | ICD-10-CM

## 2022-11-22 MED ORDER — METHYLPREDNISOLONE ACETATE 40 MG/ML IJ SUSP
40.0000 mg | INTRAMUSCULAR | Status: AC | PRN
Start: 2022-11-22 — End: 2022-11-22
  Administered 2022-11-22: 40 mg via INTRA_ARTICULAR

## 2022-11-22 MED ORDER — LIDOCAINE HCL 1 % IJ SOLN
0.5000 mL | INTRAMUSCULAR | Status: AC | PRN
Start: 2022-11-22 — End: 2022-11-22
  Administered 2022-11-22: .5 mL

## 2022-11-22 MED ORDER — BUPIVACAINE HCL 0.25 % IJ SOLN
4.0000 mL | INTRAMUSCULAR | Status: AC | PRN
Start: 2022-11-22 — End: 2022-11-22
  Administered 2022-11-22: 4 mL via INTRA_ARTICULAR

## 2022-11-22 MED ORDER — BUPIVACAINE HCL 0.5 % IJ SOLN
3.0000 mL | INTRAMUSCULAR | Status: AC | PRN
Start: 2022-11-22 — End: 2022-11-22
  Administered 2022-11-22: 3 mL via INTRA_ARTICULAR

## 2022-11-22 NOTE — Progress Notes (Signed)
Office Visit Note   Patient: Maureen Flores           Date of Birth: 22-Oct-1970           MRN: [id number] Visit Date: 11/20/2022              Requested by: Angelica Chessman, MD [address] [place],  [place] [address] PCP: Angelica Chessman, MD   Assessment & Plan: Visit Diagnoses:  1. Bilateral primary osteoarthritis of knee     Plan: Right and left knee injections performed she tolerated well.  Follow-up as needed.  She will continue to work on weight loss and we encouraged this since she understands that goal weight needs to be 40 for BMI to proceed with total knee arthroplasty.  Follow-Up Instructions: No follow-ups on file.   Orders:  Orders Placed This Encounter  Procedures   Large Joint Inj   Large Joint Inj   No orders of the defined types were placed in this encounter.     Procedures: Large Joint Inj: R knee on 11/22/2022 12:40 PM Indications: pain and joint swelling Details: 22 G 1.5 in needle, anterolateral approach  Arthrogram: No  Medications: 40 mg methylPREDNISolone acetate 40 MG/ML; 0.5 mL lidocaine 1 %; 4 mL bupivacaine 0.25 % Outcome: tolerated well, no immediate complications Procedure, treatment alternatives, risks and benefits explained, specific risks discussed. Consent was given by the patient. Immediately prior to procedure a time out was called to verify the correct patient, procedure, equipment, support staff and site/side marked as required. Patient was prepped and draped in the usual sterile fashion.    Large Joint Inj: L knee on 11/22/2022 12:40 PM Indications: joint swelling and pain Details: 22 G 1.5 in needle, anterolateral approach  Arthrogram: No  Medications: 0.5 mL lidocaine 1 %; 3 mL bupivacaine 0.5 %; 40 mg methylPREDNISolone acetate 40 MG/ML Outcome: tolerated well, no immediate complications Procedure, treatment alternatives, risks and benefits explained, specific risks discussed. Consent was given by the patient.  Immediately prior to procedure a time out was called to verify the correct patient, procedure, equipment, support staff and site/side marked as required. Patient was prepped and draped in the usual sterile fashion.       Clinical Data: No additional findings.   Subjective: Chief Complaint  Patient presents with   Right Knee - Pain   Left Knee - Pain    HPI bilateral severe knee osteoarthritis with BMI 51.  She is trying to work on weight loss.  She is use gabapentin and duloxetine.  Previous injection August 2023 gave her relief for many months she is requesting repeat injections.  Review of Systems unchanged   Objective: Vital Signs: Ht  (1.651 m)   Wt (!) 312 lb (141.5 kg)   BMI 51.92 kg/m   Physical Exam Constitutional:      Appearance: She is well-developed.  HENT:     Head: Normocephalic.     Right Ear: External ear normal.     Left Ear: External ear normal. There is no impacted cerumen.  Eyes:     Pupils: Pupils are equal, round, and reactive to light.  Neck:     Thyroid: No thyromegaly.     Trachea: No tracheal deviation.  Cardiovascular:     Rate and Rhythm: Normal rate.  Pulmonary:     Effort: Pulmonary effort is normal.  Abdominal:     Palpations: Abdomen is soft.  Musculoskeletal:     Cervical back:  No rigidity.  Skin:    General: Skin is warm and dry.  Neurological:     Mental Status: She is alert and oriented to person, place, and time.  Psychiatric:        Behavior: Behavior normal.     Ortho Exam bilateral knee crepitus with knee effusion.  Negative logroll hips.  Specialty Comments:  No specialty comments available.  Imaging: No results found.   PMFS History: Patient Active Problem List   Diagnosis Date Noted   H/O gastric sleeve 09/13/2022   Prediabetes 09/13/2022   Primary hypertension 09/13/2022   Pure hypercholesterolemia 09/13/2022   Bilateral primary osteoarthritis of knee 01/07/2022   Trochanteric bursitis, right  hip 01/07/2022   Class 3 severe obesity with serious comorbidity and body mass index (BMI) of 50.0 to 59.9 in adult 04/17/2021   Past Medical History:  Diagnosis Date   Asthma    Diabetes mellitus without complication    type 2   Headache    Hypertension    Obesity    Osteoarthritis     No family history on file.  Past Surgical History:  Procedure Laterality Date   CESAREAN SECTION     LAPAROSCOPIC GASTRIC SLEEVE RESECTION N/A 04/17/2021   Procedure: LAPAROSCOPIC GASTRIC SLEEVE RESECTION;  Surgeon: Berna Bue, MD;  Location: [facility];  Service: General;  Laterality: N/A;   UPPER GI ENDOSCOPY N/A 04/17/2021   Procedure: UPPER GI ENDOSCOPY;  Surgeon: Berna Bue, MD;  Location: [facility];  Service: General;  Laterality: N/A;   WISDOM TOOTH EXTRACTION     Social History   Occupational History   Not on file  Tobacco Use   Smoking status: Never   Smokeless tobacco: Never  Vaping Use   Vaping Use: Never used  Substance and Sexual Activity   Alcohol use: Yes    Comment: social wine   Drug use: No   Sexual activity: Not on file

## 2022-11-27 ENCOUNTER — Ambulatory Visit: Payer: 59 | Admitting: Orthopaedic Surgery

## 2022-11-27 DIAGNOSIS — Z0289 Encounter for other administrative examinations: Secondary | ICD-10-CM

## 2022-12-04 ENCOUNTER — Encounter (INDEPENDENT_AMBULATORY_CARE_PROVIDER_SITE_OTHER): Payer: Self-pay | Admitting: Internal Medicine

## 2022-12-04 ENCOUNTER — Ambulatory Visit (INDEPENDENT_AMBULATORY_CARE_PROVIDER_SITE_OTHER): Payer: 59 | Admitting: Internal Medicine

## 2022-12-04 VITALS — BP 134/82 | HR 84 | Temp 98.5°F | Ht 64.0 in | Wt 302.0 lb

## 2022-12-04 DIAGNOSIS — F32A Depression, unspecified: Secondary | ICD-10-CM

## 2022-12-04 DIAGNOSIS — E7800 Pure hypercholesterolemia, unspecified: Secondary | ICD-10-CM

## 2022-12-04 DIAGNOSIS — I10 Essential (primary) hypertension: Secondary | ICD-10-CM

## 2022-12-04 DIAGNOSIS — R5383 Other fatigue: Secondary | ICD-10-CM | POA: Diagnosis not present

## 2022-12-04 DIAGNOSIS — Z6841 Body Mass Index (BMI) 40.0 and over, adult: Secondary | ICD-10-CM

## 2022-12-04 DIAGNOSIS — R0602 Shortness of breath: Secondary | ICD-10-CM | POA: Diagnosis not present

## 2022-12-04 DIAGNOSIS — R7303 Prediabetes: Secondary | ICD-10-CM | POA: Diagnosis not present

## 2022-12-04 DIAGNOSIS — Z903 Acquired absence of stomach [part of]: Secondary | ICD-10-CM

## 2022-12-04 DIAGNOSIS — Z1331 Encounter for screening for depression: Secondary | ICD-10-CM

## 2022-12-04 NOTE — Assessment & Plan Note (Signed)
Patient likely experiencing metabolic adaptations associated with gastric bypass.  This will make weight loss more challenging but not unreasonable.

## 2022-12-04 NOTE — Assessment & Plan Note (Addendum)
    We are checking a fasting lipid profile today and also assess cardiovascular risk.  She is currently on simvastatin 10 mg a day without adverse effects.  She claims not having an elevated cholesterol I suspect this was started for cardiovascular prevention.  We will review records further

## 2022-12-04 NOTE — Assessment & Plan Note (Signed)
Blood pressure is above target.  Most recent renal parameters 08/18/2020 shows normal GFR low normal potassium.  She is currently on amlodipine, carvedilol, losartan.  We will assess adherence to regimen.  I also recommend monitoring blood pressure at home in the morning and also before bedtime.  We will also repeat renal parameters today

## 2022-12-04 NOTE — Assessment & Plan Note (Signed)
I reviewed most recent A1c was 6.5 which is in the diabetes range.  I suspect he might of had a history of diabetes instead of prediabetes.  She has been on metformin in the past.  I will check a fasting blood glucose, insulin levels and hemoglobin A1c.  She may also be a candidate for incretin therapy.

## 2022-12-04 NOTE — Progress Notes (Unsigned)
Chief Complaint:   OBESITY Maureen Flores (MR# [id number]) is a 52 y.o. female who presents for evaluation and treatment of obesity and related comorbidities. Current BMI is Body mass index is 51.84 kg/m. Maureen Flores has been struggling with her weight for many years and has been unsuccessful in either losing weight, maintaining weight loss, or reaching her healthy weight goal.  Maureen Flores is currently in the action stage of change and ready to dedicate time achieving and maintaining a healthier weight. Maureen Flores is interested in becoming our patient and working on intensive lifestyle modifications including (but not limited to) diet and exercise for weight loss.  Maureen Flores's habits were reviewed today and are as follows: Her family eats meals together, she thinks her family will eat healthier with her, her desired weight loss is 102 lbs, she has been heavy most of her life, she started gaining weight after pregnancy, her heaviest weight ever was 435 pounds, she has significant food cravings issues, she skips meals frequently, she is frequently drinking liquids with calories, and she frequently makes poor food choices.  Depression Screen Maureen Flores's Food and Mood (modified PHQ-9) score was 9.    Subjective:   1. Other fatigue Maureen Flores admits to daytime somnolence and admits to waking up still tired. Patient has a history of symptoms of daytime fatigue, morning fatigue, and morning headache. Maureen Flores generally gets 6 hours of sleep per night, and states that she has nightime awakenings. Snoring is present. Apneic episodes are not present. Epworth Sleepiness Score is 4.   2. SOB (shortness of breath) on exertion Maureen Flores notes increasing shortness of breath with exercising and seems to be worsening over time with weight gain. She notes getting out of breath sooner with activity than she used to. This has not gotten worse recently. Maureen Flores denies shortness of breath at rest or orthopnea.  3. H/O gastric  sleeve Patient likely experiencing metabolic adaptations associated with gastric bypass.  4. Prediabetes I reviewed most recent A1c was 6.5 which is in the diabetes range.  I suspect he might of had a history of diabetes instead of prediabetes.  She has been on metformin in the past.  5. Primary hypertension Blood pressure is above target.  Most recent renal parameters 08/18/2020 shows normal GFR low normal potassium.  She is currently on amlodipine, carvedilol, losartan.  6. Pure hypercholesterolemia We are checking a fasting lipid profile today and also assess cardiovascular risk.  She is currently on simvastatin 10 mg a day without adverse effects.   Assessment/Plan:   1. Other fatigue Maureen Flores does feel that her weight is causing her energy to be lower than it should be. Fatigue may be related to obesity, depression or many other causes. Labs will be ordered, and in the meanwhile, Maureen Flores will focus on self care including making healthy food choices, increasing physical activity and focusing on stress reduction.  - Vitamin B12 - CBC with Differential/Platelet  2. SOB (shortness of breath) on exertion Maureen Flores does feel that she gets out of breath more easily that she used to when she exercises. Maureen Flores's shortness of breath appears to be obesity related and exercise induced. She has agreed to work on weight loss and gradually increase exercise to treat her exercise induced shortness of breath. Will continue to monitor closely.  3. H/O gastric sleeve This will make weight loss more challenging but not unreasonable.  4. Prediabetes I will check a fasting blood glucose, insulin levels and hemoglobin A1c.  She may also be a  candidate for incretin therapy.  - Comprehensive metabolic panel - Hemoglobin A1c - Insulin, random  5. Primary hypertension We will assess adherence to regimen.  I also recommend monitoring blood pressure at home in the morning and also before bedtime.  We will also  repeat renal parameters today.  6. Pure hypercholesterolemia She claims not having an elevated cholesterol I suspect this was started for cardiovascular prevention.  We will review records further.  - Lipid Panel With LDL/HDL Ratio  7. Depression screen November had a positive depression screening. Depression is commonly associated with obesity and often results in emotional eating behaviors. We will monitor this closely and work on CBT to help improve the non-hunger eating patterns. Referral to Psychology may be required if no improvement is seen as she continues in our clinic.  8. Class 3 severe obesity with serious comorbidity and body mass index (BMI) of 50.0 to 59.9 in adult, unspecified obesity type [name]) Patient has essentially been following postoperative diet for the last few years this is likely resulted in metabolic adaptations and weight regain.  This may also have affected her microbiome due to lack of variety of foods.  She will likely need microdosing to introduce a more balanced diet.  She will work on implementing plan but reduced portions and introducing more fruits and vegetables gradually.  She acknowledges having intolerance to this foods and I suspect may be related to these metabolic changes.  - TSH - VITAMIN D 25 Hydroxy (Vit-D Deficiency, Fractures)  [name] is currently in the action stage of change and her goal is to continue with weight loss efforts. I recommend [name] begin the structured treatment plan as follows:  She has agreed to the Category 2 Plan.  She will need to microdose whole foods, mostly liquid diet since surgery.   Exercise goals: No exercise has been prescribed at this time.   Behavioral modification strategies: increasing lean protein intake, decreasing simple carbohydrates, increasing vegetables, increasing water intake, no skipping meals, meal planning and cooking strategies, keeping healthy foods in the home, better snacking choices, and planning  for success.  She was informed of the importance of frequent follow-up visits to maximize her success with intensive lifestyle modifications for her multiple health conditions. She was informed we would discuss her lab results at her next visit unless there is a critical issue that needs to be addressed sooner. Saray agreed to keep her next visit at the agreed upon time to discuss these results.  Objective:   Blood pressure 134/82, pulse 84, temperature 98.5 F (36.9 C), height 5\' 4"  (1.626 m), weight (!) 302 lb (137 kg), SpO2 96 %. Body mass index is 51.84 kg/m.  EKG: Normal sinus rhythm, rate (unable to obtain).  Indirect Calorimeter completed today shows a VO2 of 295 and a REE of 2030.  Her calculated basal metabolic rate is 4540 thus her basal metabolic rate is better than expected.  General: Cooperative, alert, well developed, in no acute distress. HEENT: Conjunctivae and lids unremarkable. Cardiovascular: Regular rhythm.  Lungs: Normal work of breathing. Neurologic: No focal deficits.   Lab Results  Component Value Date   CREATININE 0.97 04/18/2021   BUN 24 (H) 04/18/2021   NA 139 04/18/2021   K 3.6 04/18/2021   CL 103 04/18/2021   CO2 23 04/18/2021   Lab Results  Component Value Date   ALT 26 04/18/2021   AST 24 04/18/2021   ALKPHOS 71 04/18/2021   BILITOT 0.6 04/18/2021   Lab Results  Component Value Date   HGBA1C 6.5 (H) 03/28/2021   No results found for: "INSULIN" No results found for: "TSH" No results found for: "CHOL", "HDL", "LDLCALC", "LDLDIRECT", "TRIG", "CHOLHDL" Lab Results  Component Value Date   WBC 13.0 (H) 04/18/2021   HGB 13.4 04/18/2021   HCT 41.8 04/18/2021   MCV 87.3 04/18/2021   PLT 334 04/18/2021   No results found for: "IRON", "TIBC", "FERRITIN"  Attestation Statements:   Reviewed by clinician on day of visit: allergies, medications, problem list, medical history, surgical history, family history, social history, and previous  encounter notes.  Time spent on visit including pre-visit chart review and post-visit charting and care was 40 minutes.   Trude Mcburney, am acting as transcriptionist for Worthy Rancher, MD.  I have reviewed the above documentation for accuracy and completeness, and I agree with the above. -Worthy Rancher, MD

## 2022-12-04 NOTE — Assessment & Plan Note (Signed)
Patient has essentially been following postoperative diet for the last few years this is likely resulted in metabolic adaptations and weight regain.  This may also have affected her microbiome due to lack of variety of foods.  She will likely need microdosing to introduce a more balanced diet.  She will work on implementing plan but reduced portions and introducing more fruits and vegetables gradually.  She acknowledges having intolerance to this foods and I suspect may be related to these metabolic changes.

## 2022-12-05 LAB — COMPREHENSIVE METABOLIC PANEL
ALT: 13 IU/L (ref 0–32)
AST: 18 IU/L (ref 0–40)
Albumin/Globulin Ratio: 1.2 (ref 1.2–2.2)
Albumin: 4 g/dL (ref 3.8–4.9)
Alkaline Phosphatase: 102 IU/L (ref 44–121)
BUN/Creatinine Ratio: 22 (ref 9–23)
BUN: 20 mg/dL (ref 6–24)
Bilirubin Total: 0.3 mg/dL (ref 0.0–1.2)
CO2: 23 mmol/L (ref 20–29)
Calcium: 9.4 mg/dL (ref 8.7–10.2)
Chloride: 102 mmol/L (ref 96–106)
Creatinine, Ser: 0.93 mg/dL (ref 0.57–1.00)
Globulin, Total: 3.4 g/dL (ref 1.5–4.5)
Glucose: 73 mg/dL (ref 70–99)
Potassium: 4.3 mmol/L (ref 3.5–5.2)
Sodium: 144 mmol/L (ref 134–144)
Total Protein: 7.4 g/dL (ref 6.0–8.5)
eGFR: 74 mL/min/{1.73_m2} (ref >59)

## 2022-12-05 LAB — CBC WITH DIFFERENTIAL/PLATELET
Basophils Absolute: 0.1 10*3/uL (ref 0.0–0.2)
Basos: 1 %
EOS (ABSOLUTE): 0.2 10*3/uL (ref 0.0–0.4)
Eos: 2 %
Hematocrit: 43.3 % (ref 34.0–46.6)
Hemoglobin: 13.6 g/dL (ref 11.1–15.9)
Immature Grans (Abs): 0 10*3/uL (ref 0.0–0.1)
Immature Granulocytes: 0 %
Lymphocytes Absolute: 3.1 10*3/uL (ref 0.7–3.1)
Lymphs: 36 %
MCH: 27.7 pg (ref 26.6–33.0)
MCHC: 31.4 g/dL — ABNORMAL LOW (ref 31.5–35.7)
MCV: 88 fL (ref 79–97)
Monocytes Absolute: 0.6 10*3/uL (ref 0.1–0.9)
Monocytes: 7 %
Neutrophils Absolute: 4.7 10*3/uL (ref 1.4–7.0)
Neutrophils: 54 %
Platelets: 280 10*3/uL (ref 150–450)
RBC: 4.91 x10E6/uL (ref 3.77–5.28)
RDW: 13.9 % (ref 11.7–15.4)
WBC: 8.6 10*3/uL (ref 3.4–10.8)

## 2022-12-05 LAB — LIPID PANEL WITH LDL/HDL RATIO
Cholesterol, Total: 132 mg/dL (ref 100–199)
HDL: 56 mg/dL (ref >39)
LDL Chol Calc (NIH): 64 mg/dL (ref 0–99)
LDL/HDL Ratio: 1.1 ratio (ref 0.0–3.2)
Triglycerides: 58 mg/dL (ref 0–149)
VLDL Cholesterol Cal: 12 mg/dL (ref 5–40)

## 2022-12-05 LAB — INSULIN, RANDOM: INSULIN: 12 u[IU]/mL (ref 2.6–24.9)

## 2022-12-05 LAB — TSH: TSH: 1.25 u[IU]/mL (ref 0.450–4.500)

## 2022-12-05 LAB — HEMOGLOBIN A1C
Est. average glucose Bld gHb Est-mCnc: 117 mg/dL
Hgb A1c MFr Bld: 5.7 % — ABNORMAL HIGH (ref 4.8–5.6)

## 2022-12-05 LAB — VITAMIN D 25 HYDROXY (VIT D DEFICIENCY, FRACTURES): Vit D, 25-Hydroxy: 42.9 ng/mL (ref 30.0–100.0)

## 2022-12-05 LAB — VITAMIN B12: Vitamin B-12: 849 pg/mL (ref 232–1245)

## 2022-12-18 ENCOUNTER — Ambulatory Visit (INDEPENDENT_AMBULATORY_CARE_PROVIDER_SITE_OTHER): Payer: 59 | Admitting: Internal Medicine

## 2022-12-18 ENCOUNTER — Encounter (INDEPENDENT_AMBULATORY_CARE_PROVIDER_SITE_OTHER): Payer: Self-pay | Admitting: Internal Medicine

## 2022-12-18 VITALS — BP 143/82 | HR 64 | Temp 97.9°F | Ht 64.0 in | Wt 318.0 lb

## 2022-12-18 DIAGNOSIS — R7303 Prediabetes: Secondary | ICD-10-CM

## 2022-12-18 DIAGNOSIS — E7800 Pure hypercholesterolemia, unspecified: Secondary | ICD-10-CM

## 2022-12-18 DIAGNOSIS — I10 Essential (primary) hypertension: Secondary | ICD-10-CM | POA: Diagnosis not present

## 2022-12-18 DIAGNOSIS — Z903 Acquired absence of stomach [part of]: Secondary | ICD-10-CM

## 2022-12-18 DIAGNOSIS — Z6841 Body Mass Index (BMI) 40.0 and over, adult: Secondary | ICD-10-CM

## 2022-12-18 NOTE — Assessment & Plan Note (Signed)
LDL is at goal.  She is on simvastatin 10 mg a day without any adverse effects.   Her 10 year risk is: The 10-year ASCVD risk score (Arnett DK, et al., 2019) is: 9.4%  Lab Results  Component Value Date   CHOL 132 12/04/2022   HDL 56 12/04/2022   LDLCALC 64 12/04/2022   TRIG 58 12/04/2022    Continue weight loss therapy, losing 10% or more of body weight may improve condition. Also advised to reduce saturated fats in diet to less than 10% of daily calories.

## 2022-12-18 NOTE — Assessment & Plan Note (Signed)
Her blood pressure is not well-controlled.  Her cardiovascular risk is about 10% she would benefit from improved blood pressure control.  Losing 10% of body weight may improve blood pressure control.  I advised patient to check blood pressures at home in the morning and also before bedtime and to work with primary care team for treatment intensification.  It also seems that she is experiencing some edema related to amlodipine and will discuss this further with PCP.

## 2022-12-18 NOTE — Progress Notes (Addendum)
Office: [phone]  /  Fax: [phone]  WEIGHT SUMMARY AND BIOMETRICS  Vitals Temp: 97.9 F (36.6 C) BP: (!) 143/82 Pulse Rate: 64 SpO2: 99 %   Anthropometric Measurements Height: 5\' 4"  (1.626 m) Weight: (!) 318 lb (144.2 kg) BMI (Calculated): 54.56 Weight at Last Visit: 302 lb Weight Lost Since Last Visit: 0 Weight Gained Since Last Visit: 16 lb Starting Weight: 302 lb Total Weight Loss (lbs): 0 lb (0 kg) Peak Weight: 435 lb   Body Composition  Body Fat %: 68.3 % Fat Mass (lbs): 217.6 lbs Muscle Mass (lbs): 95.8 lbs Visceral Fat Rating : 27    No data recorded Today's Visit #: 2  Starting Date: 12/04/22  The 10-year ASCVD risk score (Arnett DK, et al., 2019) is: 9.4%  HPI  Chief Complaint: OBESITY  Interval History:  Since last office visit she has []  lost []  maintained [x] gained weight 16 lbs and doesn't know why. She recently restarted gabapentin which she was not aware that could cause weight gain.  She has been gradually working on implementing plan and has not been tracking or journaling calories.  She did track calories when she was getting qualified for gastric bypass.  Her 24-hour recall: Breakfast overnight oats about a cup 220 cal for lunch she had leftovers which was pork and some vegetables.  She snacks on popcorn and [employer]  She does acknowledge being snacking more.  She denies consumption of liquid calories and has cut back on sweet tea.  Adherence to nutrition plan :  []  Excellent []  Good []  Fair []  Suboptimal [x]  Variable []  Gradual implementation [] Has not started implementation  Orexigenic Control: [x] Denies [] Reports problems with appetite and hunger signals.  [x] Denies [] Reports problems with satiety and satiation.  [x] Denies [] Reports problems with eating patterns and portion control.  [x] Denies [] Reports strong cravings for highly palatable foods [x] Denies [] Reports problems with feeling restricted or deprived    Barriers  identified: having difficulty preparing healthy meals, having difficulty focusing on healthy eating, predilection for convenience or prepackaged foods, and obese agenic drugs .   Pharmacotherapy for weight loss: She is currently taking no anti-obesity medication.    ASSESSMENT AND PLAN  TREATMENT PLAN FOR OBESITY:  Recommended Dietary Goals  Maureen Flores is currently in the action stage of change. As such, her goal is to continue weight management plan. She has agreed to: keep a food journal with a target of  1200 cal calories and 90 grams of protein  and continue current plan.  She benefits from tracking calories.  Her weight loss zone starts under 1600 cal.  Behavioral Intervention  We discussed the following Behavioral Modification Strategies today: increasing lean protein intake, decreasing simple carbohydrates , increasing vegetables, increasing lower glycemic fruits, increasing fiber rich foods, avoiding skipping meals, increasing water intake, work on tracking and journaling calories using tracking application, reading food labels , identifying sources and decreasing liquid calories, planning for success, and better snacking choices.  Additional resources provided today: Handout and personalized instruction on tracking and journaling using Apps  Recommended Physical Activity Goals  Maureen Flores has been advised to work up to 150 minutes of moderate intensity aerobic activity a week and strengthening exercises 2-3 times per week for cardiovascular health, weight loss maintenance and preservation of muscle mass.   She has agreed to :  Think about ways to increase daily physical activity and overcoming barriers to exercise  Pharmacotherapy We have discussed various medication options to help Maureen Flores with her weight loss efforts  and we both agreed to : continue with nutritional and behavioral strategies  ASSOCIATED CONDITIONS ADDRESSED TODAY  H/O gastric sleeve Assessment & Plan: Patient had  upper GI series recently which I reviewed.  Shows normal postoperative changes she does have a small sliding hiatal hernia.  She is scheduled to see bariatric surgeon in the near future.   Pure hypercholesterolemia Assessment & Plan: LDL is at goal.  She is on simvastatin 10 mg a day without any adverse effects.   Her 10 year risk is: The 10-year ASCVD risk score (Arnett DK, et al., 2019) is: 9.4%  Lab Results  Component Value Date   CHOL 132 12/04/2022   HDL 56 12/04/2022   LDLCALC 64 12/04/2022   TRIG 58 12/04/2022    Continue weight loss therapy, losing 10% or more of body weight may improve condition. Also advised to reduce saturated fats in diet to less than 10% of daily calories.        Prediabetes Assessment & Plan: Most recent A1c is 5.7 which is improved from 6.5 a year ago.  She has mild insulin resistance with a Homa IR of 2.16.  She may benefit from treatment with metformin for incretin effect in pharmacoprophylaxis.  We will hold off starting medication as we continue to work on nutritional strategies.   Primary hypertension Assessment & Plan: Her blood pressure is not well-controlled.  Her cardiovascular risk is about 10% she would benefit from improved blood pressure control.  Losing 10% of body weight may improve blood pressure control.  I advised patient to check blood pressures at home in the morning and also before bedtime and to work with primary care team for treatment intensification.  It also seems that she is experiencing some edema related to amlodipine and will discuss this further with PCP.   Class 3 severe obesity with serious comorbidity and body mass index (BMI) of 50.0 to 59.9 in adult, unspecified obesity type [facility]) Assessment & Plan: Patient will work with prescribing physician to see if she could stop gabapentin.  She gained a significant amount of weight after restarting medication.  This medication can also cause edema in addition to weight gain.   It is going to be hard for her to lose weight in the presence of this medication considering metabolic adaptations taking place following gastric sleeve surgery.  In addition I think her indirect calorimetry may be over estimating BMR, we will proceed with calculated BMR to generate calorie deficit.  Patient will also start to journal and track calories.  I would like for her to shoot for 1200 cal a day and 90 g of protein     PHYSICAL EXAM:  Blood pressure (!) 143/82, pulse 64, temperature 97.9 F (36.6 C), height 5\' 4"  (1.626 m), weight (!) 318 lb (144.2 kg), SpO2 99 %. Body mass index is 54.58 kg/m.  General: She is overweight, cooperative, alert, well developed, and in no acute distress. PSYCH: Has normal mood, affect and thought process.   HEENT: EOMI, sclerae are anicteric. Lungs: Normal breathing effort, no conversational dyspnea. Extremities: No edema.  Neurologic: No gross sensory or motor deficits. No tremors or fasciculations noted.    DIAGNOSTIC DATA REVIEWED:  BMET    Component Value Date/Time   NA 144 12/04/2022 1243   K 4.3 12/04/2022 1243   CL 102 12/04/2022 1243   CO2 23 12/04/2022 1243   GLUCOSE 73 12/04/2022 1243   GLUCOSE 138 (H) 04/18/2021 0710   BUN 20 12/04/2022 1243  CREATININE 0.93 12/04/2022 1243   CALCIUM 9.4 12/04/2022 1243   GFRNONAA >60 04/18/2021 0710   GFRAA >60 06/17/2017 1445   EGFR 74 12/04/2022 1243   Lab Results  Component Value Date   HGBA1C 5.7 (H) 12/04/2022   HGBA1C 6.5 (H) 03/28/2021   Lab Results  Component Value Date   INSULIN 12.0 12/04/2022   Lab Results  Component Value Date   TSH 1.250 12/04/2022   CBC    Component Value Date/Time   WBC 8.6 12/04/2022 1243   WBC 13.0 (H) 04/18/2021 0442   RBC 4.91 12/04/2022 1243   RBC 4.79 04/18/2021 0442   HGB 13.6 12/04/2022 1243   HCT 43.3 12/04/2022 1243   PLT 280 12/04/2022 1243   MCV 88 12/04/2022 1243   MCH 27.7 12/04/2022 1243   MCH 28.0 04/18/2021 0442   MCHC  31.4 (L) 12/04/2022 1243   MCHC 32.1 04/18/2021 0442   RDW 13.9 12/04/2022 1243   Iron Studies No results found for: "IRON", "TIBC", "FERRITIN", "IRONPCTSAT" Lipid Panel     Component Value Date/Time   CHOL 132 12/04/2022 1243   TRIG 58 12/04/2022 1243   HDL 56 12/04/2022 1243   LDLCALC 64 12/04/2022 1243   Hepatic Function Panel     Component Value Date/Time   PROT 7.4 12/04/2022 1243   ALBUMIN 4.0 12/04/2022 1243   AST 18 12/04/2022 1243   ALT 13 12/04/2022 1243   ALKPHOS 102 12/04/2022 1243   BILITOT 0.3 12/04/2022 1243      Component Value Date/Time   TSH 1.250 12/04/2022 1243   Nutritional Lab Results  Component Value Date   VD25OH 42.9 12/04/2022     Return in about 2 weeks (around 01/01/2023) for For Weight Mangement with Dr. Rikki Spearing.Marland Kitchen She was informed of the importance of frequent follow up visits to maximize her success with intensive lifestyle modifications for her multiple health conditions.   ATTESTASTION STATEMENTS:  Reviewed by clinician on day of visit: allergies, medications, problem list, medical history, surgical history, family history, social history, and previous encounter notes.     Worthy Rancher, MD

## 2022-12-18 NOTE — Assessment & Plan Note (Signed)
Most recent A1c is 5.7 which is improved from 6.5 a year ago.  She has mild insulin resistance with a Homa IR of 2.16.  She may benefit from treatment with metformin for incretin effect in pharmacoprophylaxis.  We will hold off starting medication as we continue to work on nutritional strategies.

## 2022-12-18 NOTE — Assessment & Plan Note (Signed)
Patient had upper GI series recently which I reviewed.  Shows normal postoperative changes she does have a small sliding hiatal hernia.  She is scheduled to see bariatric surgeon in the near future.

## 2022-12-18 NOTE — Assessment & Plan Note (Addendum)
Patient will work with prescribing physician to see if she could stop gabapentin.  She gained a significant amount of weight after restarting medication.  This medication can also cause edema in addition to weight gain.  It is going to be hard for her to lose weight in the presence of this medication considering metabolic adaptations taking place following gastric sleeve surgery.  In addition I think her indirect calorimetry may be over estimating BMR, we will proceed with calculated BMR to generate calorie deficit.  Patient will also start to journal and track calories.  I would like for her to shoot for 1200 cal a day and 90 g of protein

## 2022-12-27 ENCOUNTER — Telehealth: Payer: Self-pay

## 2022-12-27 ENCOUNTER — Other Ambulatory Visit: Payer: Self-pay

## 2022-12-27 DIAGNOSIS — K219 Gastro-esophageal reflux disease without esophagitis: Secondary | ICD-10-CM

## 2022-12-27 NOTE — Telephone Encounter (Signed)
Referral for esophageal manometry with 24 hour impedane/pH study Referring provider Dr Phylliss Blakes Appointment and procedure information mailed to the patient. Attempted to reach by telephone x 2. Left detailed voicemail.  Appointment 05/15/23 at 8:30 am at the [facility].

## 2023-01-08 ENCOUNTER — Ambulatory Visit (INDEPENDENT_AMBULATORY_CARE_PROVIDER_SITE_OTHER): Payer: 59 | Admitting: Internal Medicine

## 2023-01-08 ENCOUNTER — Encounter (INDEPENDENT_AMBULATORY_CARE_PROVIDER_SITE_OTHER): Payer: Self-pay | Admitting: Internal Medicine

## 2023-01-08 VITALS — BP 140/65 | HR 60 | Temp 98.2°F | Ht 64.0 in | Wt 309.0 lb

## 2023-01-08 DIAGNOSIS — E669 Obesity, unspecified: Secondary | ICD-10-CM

## 2023-01-08 DIAGNOSIS — I10 Essential (primary) hypertension: Secondary | ICD-10-CM

## 2023-01-08 DIAGNOSIS — Z6841 Body Mass Index (BMI) 40.0 and over, adult: Secondary | ICD-10-CM

## 2023-01-08 DIAGNOSIS — K449 Diaphragmatic hernia without obstruction or gangrene: Secondary | ICD-10-CM

## 2023-01-08 DIAGNOSIS — Z903 Acquired absence of stomach [part of]: Secondary | ICD-10-CM

## 2023-01-08 DIAGNOSIS — K219 Gastro-esophageal reflux disease without esophagitis: Secondary | ICD-10-CM

## 2023-01-08 DIAGNOSIS — R7303 Prediabetes: Secondary | ICD-10-CM

## 2023-01-08 MED ORDER — METFORMIN HCL ER 500 MG PO TB24
500.0000 mg | ORAL_TABLET | Freq: Every day | ORAL | 0 refills | Status: DC
Start: 2023-01-08 — End: 2023-01-29

## 2023-01-08 NOTE — Assessment & Plan Note (Signed)
Most recent A1c is 5.7 which is improved from 6.5 a year ago.  She has mild insulin resistance with a Homa IR of 2.16.  After discussion of benefits and side effects she will be started on metformin XR 500 mg 1 tablet daily for incretin effect and pharmacoprophylaxis.

## 2023-01-08 NOTE — Assessment & Plan Note (Signed)
Patient had upper GI series recently which I reviewed.  Shows normal postoperative changes she does have a small sliding hiatal hernia and is experiencing acid reflux and regurgitation.  She is scheduled to see bariatric surgeon for additional workup.  She is a poor candidate for incretin therapy as it may worsen her acid reflux and regurgitation.

## 2023-01-08 NOTE — Assessment & Plan Note (Signed)
Now on PPI symptoms have not improved she will follow-up with bariatric surgery.

## 2023-01-08 NOTE — Assessment & Plan Note (Addendum)
Her blood pressure is not at goal on carvedilol, amlodpine and losartan.  Her cardiovascular risk is about 10% she would benefit from improved blood pressure control.  Losing 10% of body weight may improve blood pressure control.  I advised patient to check blood pressures at home in the morning and also before bedtime and to work with primary care team for treatment intensification.  It also seems that she is experiencing some edema related to amlodipine and will discuss this further with PCP.

## 2023-01-08 NOTE — Progress Notes (Signed)
Office: [phone]  /  Fax: [phone]  WEIGHT SUMMARY AND BIOMETRICS  Vitals Temp: 98.2 F (36.8 C) BP: (!) 140/65 Pulse Rate: 60 SpO2: 99 %   Anthropometric Measurements Height: 5\' 4"  (1.626 m) Weight: (!) 309 lb (140.2 kg) BMI (Calculated): 53.01 Weight at Last Visit: 318 lb Starting Weight: 302 lb Total Weight Loss (lbs): 0 lb (0 kg) Peak Weight: 435 lb   Body Composition  Body Fat %: 67.5 % Fat Mass (lbs): 209 lbs Muscle Mass (lbs): 95.4 lbs Visceral Fat Rating : 26    No data recorded Today's Visit #: 3  Starting Date: 12/04/22   HPI  Chief Complaint: OBESITY  Jason is here to discuss her progress with her obesity treatment plan. She is on the the Category 2 Plan and states she is following her eating plan approximately 80 % of the time. She states she is exercising 60 minutes 5 times per week.  Interval History:  Since last office visit she has lost 9 lbs. Stopped gabapentin and started water aerobics. Trying to get out of the house.  Was seen by bariatric surgery and will be having additional testing for dysphagia she also has significant acid reflux problems and was started on PPI. She reports good adherence to reduced calorie nutritional plan.  But feels like she is forcing herself to eat at times. She has been working on not skipping meals, increasing protein intake at every meal, eating more fruits, and drinking more water.  Reports problems with appetite and hunger signals.  Denies problems with satiety and satiation.  Denies problems with eating patterns and portion control.  Reports abnormal cravings for sweets.  Denies feeling deprived or restricted.   Barriers identified:  Problems with digestion .   Pharmacotherapy for weight loss: She is currently taking no anti-obesity medication.    ASSESSMENT AND PLAN  TREATMENT PLAN FOR OBESITY:  Recommended Dietary Goals  Evalene is currently in the action stage of change. As such, her  goal is to continue weight management plan. She has agreed to: continue current plan  Behavioral Intervention  We discussed the following Behavioral Modification Strategies today: increasing lean protein intake, decreasing simple carbohydrates , increasing vegetables, increasing lower glycemic fruits, increasing fiber rich foods, avoiding skipping meals, increasing water intake, continue to practice mindfulness when eating, and planning for success.  Additional resources provided today: None  Recommended Physical Activity Goals  Addley has been advised to work up to 150 minutes of moderate intensity aerobic activity a week and strengthening exercises 2-3 times per week for cardiovascular health, weight loss maintenance and preservation of muscle mass.   She has agreed to :  Think about ways to increase daily physical activity and overcoming barriers to exercise  Pharmacotherapy We discussed various medication options to help Laytoya with her weight loss efforts and we both agreed to :  She will be started on metformin with primary indication diabetes prevention.  ASSOCIATED CONDITIONS ADDRESSED TODAY  Hiatal hernia with GERD Assessment & Plan: Now on PPI symptoms have not improved she will follow-up with bariatric surgery.   Prediabetes Assessment & Plan: Most recent A1c is 5.7 which is improved from 6.5 a year ago.  She has mild insulin resistance with a Homa IR of 2.16.  After discussion of benefits and side effects she will be started on metformin XR 500 mg 1 tablet daily for incretin effect and pharmacoprophylaxis.  Orders: -     metFORMIN HCl ER; Take 1 tablet (500 mg  total) by mouth daily with breakfast.  Dispense: 30 tablet; Refill: 0  H/O gastric sleeve Assessment & Plan: Patient had upper GI series recently which I reviewed.  Shows normal postoperative changes she does have a small sliding hiatal hernia and is experiencing acid reflux and regurgitation.  She is scheduled to  see bariatric surgeon for additional workup.  She is a poor candidate for incretin therapy as it may worsen her acid reflux and regurgitation.   Primary hypertension Assessment & Plan: Her blood pressure is not at goal on carvedilol, amlodpine and losartan.  Her cardiovascular risk is about 10% she would benefit from improved blood pressure control.  Losing 10% of body weight may improve blood pressure control.  I advised patient to check blood pressures at home in the morning and also before bedtime and to work with primary care team for treatment intensification.  It also seems that she is experiencing some edema related to amlodipine and will discuss this further with PCP.     PHYSICAL EXAM:  Blood pressure (!) 140/65, pulse 60, temperature 98.2 F (36.8 C), height 5\' 4"  (1.626 m), weight (!) 309 lb (140.2 kg), SpO2 99 %. Body mass index is 53.04 kg/m.  General: She is overweight, cooperative, alert, well developed, and in no acute distress. PSYCH: Has normal mood, affect and thought process.   HEENT: EOMI, sclerae are anicteric. Lungs: Normal breathing effort, no conversational dyspnea. Extremities: No edema.  Neurologic: No gross sensory or motor deficits. No tremors or fasciculations noted.    DIAGNOSTIC DATA REVIEWED:  BMET    Component Value Date/Time   NA 144 12/04/2022 1243   K 4.3 12/04/2022 1243   CL 102 12/04/2022 1243   CO2 23 12/04/2022 1243   GLUCOSE 73 12/04/2022 1243   GLUCOSE 138 (H) 04/18/2021 0710   BUN 20 12/04/2022 1243   CREATININE 0.93 12/04/2022 1243   CALCIUM 9.4 12/04/2022 1243   GFRNONAA >60 04/18/2021 0710   GFRAA >60 06/17/2017 1445   Lab Results  Component Value Date   HGBA1C 5.7 (H) 12/04/2022   HGBA1C 6.5 (H) 03/28/2021   Lab Results  Component Value Date   INSULIN 12.0 12/04/2022   Lab Results  Component Value Date   TSH 1.250 12/04/2022   CBC    Component Value Date/Time   WBC 8.6 12/04/2022 1243   WBC 13.0 (H) 04/18/2021  0442   RBC 4.91 12/04/2022 1243   RBC 4.79 04/18/2021 0442   HGB 13.6 12/04/2022 1243   HCT 43.3 12/04/2022 1243   PLT 280 12/04/2022 1243   MCV 88 12/04/2022 1243   MCH 27.7 12/04/2022 1243   MCH 28.0 04/18/2021 0442   MCHC 31.4 (L) 12/04/2022 1243   MCHC 32.1 04/18/2021 0442   RDW 13.9 12/04/2022 1243   Iron Studies No results found for: "IRON", "TIBC", "FERRITIN", "IRONPCTSAT" Lipid Panel     Component Value Date/Time   CHOL 132 12/04/2022 1243   TRIG 58 12/04/2022 1243   HDL 56 12/04/2022 1243   LDLCALC 64 12/04/2022 1243   Hepatic Function Panel     Component Value Date/Time   PROT 7.4 12/04/2022 1243   ALBUMIN 4.0 12/04/2022 1243   AST 18 12/04/2022 1243   ALT 13 12/04/2022 1243   ALKPHOS 102 12/04/2022 1243   BILITOT 0.3 12/04/2022 1243      Component Value Date/Time   TSH 1.250 12/04/2022 1243   Nutritional Lab Results  Component Value Date   VD25OH 42.9 12/04/2022  Return in about 2 weeks (around 01/22/2023) for For Weight Mangement with Dr. Rikki Spearing.Marland Kitchen She was informed of the importance of frequent follow up visits to maximize her success with intensive lifestyle modifications for her multiple health conditions.   ATTESTASTION STATEMENTS:  Reviewed by clinician on day of visit: allergies, medications, problem list, medical history, surgical history, family history, social history, and previous encounter notes.     Worthy Rancher, MD

## 2023-01-29 ENCOUNTER — Ambulatory Visit (INDEPENDENT_AMBULATORY_CARE_PROVIDER_SITE_OTHER): Payer: 59 | Admitting: Internal Medicine

## 2023-01-29 ENCOUNTER — Encounter (INDEPENDENT_AMBULATORY_CARE_PROVIDER_SITE_OTHER): Payer: Self-pay | Admitting: Internal Medicine

## 2023-01-29 VITALS — BP 129/73 | HR 56 | Temp 97.8°F | Ht 64.0 in | Wt 306.0 lb

## 2023-01-29 DIAGNOSIS — R7303 Prediabetes: Secondary | ICD-10-CM

## 2023-01-29 DIAGNOSIS — K219 Gastro-esophageal reflux disease without esophagitis: Secondary | ICD-10-CM

## 2023-01-29 DIAGNOSIS — Z6841 Body Mass Index (BMI) 40.0 and over, adult: Secondary | ICD-10-CM

## 2023-01-29 DIAGNOSIS — F509 Eating disorder, unspecified: Secondary | ICD-10-CM

## 2023-01-29 DIAGNOSIS — K449 Diaphragmatic hernia without obstruction or gangrene: Secondary | ICD-10-CM

## 2023-01-29 DIAGNOSIS — Z903 Acquired absence of stomach [part of]: Secondary | ICD-10-CM

## 2023-01-29 DIAGNOSIS — E66813 Obesity, class 3: Secondary | ICD-10-CM

## 2023-01-29 MED ORDER — METFORMIN HCL ER 500 MG PO TB24: 500.0000 mg | ORAL_TABLET | Freq: Every day | ORAL | 0 refills | Status: DC

## 2023-01-29 NOTE — Progress Notes (Signed)
Office: [phone]  /  Fax: [phone]  WEIGHT SUMMARY AND BIOMETRICS  Vitals Temp: 97.8 F (36.6 C) BP: 129/73 Pulse Rate: (!) 56 SpO2: 99 %   Anthropometric Measurements Height: 5\' 4"  (1.626 m) Weight: (!) 306 lb (138.8 kg) BMI (Calculated): 52.5 Weight at Last Visit: 309lb Weight Lost Since Last Visit: 3lb Weight Gained Since Last Visit: 0 Starting Weight: 302lb Total Weight Loss (lbs): 0 lb (0 kg) Peak Weight: 435lb Waist Measurement : 52 inches   Body Composition  Body Fat %: 64.9 % Fat Mass (lbs): 198.8 lbs Muscle Mass (lbs): 102 lbs Visceral Fat Rating : 25    No data recorded Today's Visit #: 4  Starting Date: 12/04/22   HPI  Chief Complaint: OBESITY  Maureen Flores is here to discuss her progress with her obesity treatment plan. She is on the keeping a food journal and adhering to recommended goals of 1200 calories and unsure how much protein and states she is following her eating plan approximately 20 % of the time. She states she is exercising by walking  10-15 minutes 5 times per week.  Interval History:  Since last office visit she has lost 3 lbs. Not journaling regularly. Has a fear of eating due to concern of overeating.  She is overly concerned with going back to previous habits.  I suspect she might of had an eating disorder prior to gastric bypass.  Has been relying on protein shakes several times a day and she is resistant to a reduced calorie state likely due to metabolic adaptations following gastric bypass with restrictive eating habits.  Orixegenic Control: Denies problems with appetite and hunger signals.  Denies problems with satiety and satiation.  Denies problems with eating patterns and portion control.  Denies abnormal cravings. Denies feeling deprived or restricted.   Barriers identified:  History of gastric bypass with metabolic adaptations .   Pharmacotherapy for weight loss: She is currently taking no anti-obesity medication.     ASSESSMENT AND PLAN  TREATMENT PLAN FOR OBESITY:  Recommended Dietary Goals  Maureen Flores is currently in the action stage of change. As such, her goal is to continue weight management plan. She has agreed to: continue current plan  Behavioral Intervention  We discussed the following Behavioral Modification Strategies today: increasing lean protein intake, decreasing simple carbohydrates , increasing vegetables, increasing lower glycemic fruits, increasing fiber rich foods, avoiding skipping meals, increasing water intake, and planning for success.  She will be referred to Dr. Dewaine Conger for further evaluation.  I suspect she might of had an eating disorder prior to gastric bypass which is resulting now into restrictive eating type of pattern.  Patient also has a fear of increasing calories and obtaining protein from lean sources.  This is affecting her metabolic rate.  Additional resources provided today: None  Recommended Physical Activity Goals  Maureen Flores has been advised to work up to 150 minutes of moderate intensity aerobic activity a week and strengthening exercises 2-3 times per week for cardiovascular health, weight loss maintenance and preservation of muscle mass.   She has agreed to :  Think about ways to increase daily physical activity and overcoming barriers to exercise and Increase physical activity in their day and reduce sedentary time (increase NEAT).  Pharmacotherapy We discussed various medication options to help Maureen Flores with her weight loss efforts and we both agreed to :  Patient not a candidate for antiobesity medications at present time due to restrictive eating pattern  ASSOCIATED CONDITIONS ADDRESSED TODAY  Maureen Flores  hernia with GERD Assessment & Plan: Now on PPI symptoms symptoms have improved with increase in strength.  Continue PPI as per bariatric surgeon.   Prediabetes Assessment & Plan: Most recent A1c is 5.7 which is improved from 6.5 a year ago.  She has mild  insulin resistance with a Homa IR of 2.16.  Currently on metformin 500 mg XR 1 tablet daily.  We will continue medication for pharmacoprophylaxis.  Orders: -     metFORMIN HCl ER; Take 1 tablet (500 mg total) by mouth daily with breakfast.  Dispense: 30 tablet; Refill: 0  H/O gastric sleeve Assessment & Plan: Patient had upper GI series recently which I reviewed.  Shows normal postoperative changes she does have a small sliding Maureen Flores hernia and is experiencing acid reflux and regurgitation.  This is improved with PPI.  She is scheduled to see bariatric surgeon for additional workup.  She is a poor candidate for incretin therapy as it may worsen her acid reflux and regurgitation.  She also has a restrictive eating pattern.   Class 3 severe obesity with serious comorbidity and body mass index (BMI) of 50.0 to 59.9 in adult, unspecified obesity type (HCC)  Eating disorder, unspecified type Assessment & Plan: Patient is overly concerned about increasing calories and getting protein from [employer].  She has been relying on protein shakes post bariatric surgery.  Based on recall her protein consumption and calories is under 1200 cal which is not resulting in weight loss.  She had lost weight when she increased her food intake at the beginning.  I feel she would benefit from cognitive behavioral therapy.  We will refer her to Dr. Dewaine Conger.     PHYSICAL EXAM:  Blood pressure 129/73, pulse (!) 56, temperature 97.8 F (36.6 C), height 5\' 4"  (1.626 m), weight (!) 306 lb (138.8 kg), SpO2 99 %. Body mass index is 52.52 kg/m.  General: She is overweight, cooperative, alert, well developed, and in no acute distress. PSYCH: Has normal mood, affect and thought process.   HEENT: EOMI, sclerae are anicteric. Lungs: Normal breathing effort, no conversational dyspnea. Extremities: No edema.  Neurologic: No gross sensory or motor deficits. No tremors or fasciculations noted.    DIAGNOSTIC DATA  REVIEWED:  BMET    Component Value Date/Time   NA 144 12/04/2022 1243   K 4.3 12/04/2022 1243   CL 102 12/04/2022 1243   CO2 23 12/04/2022 1243   GLUCOSE 73 12/04/2022 1243   GLUCOSE 138 (H) 04/18/2021 0710   BUN 20 12/04/2022 1243   CREATININE 0.93 12/04/2022 1243   CALCIUM 9.4 12/04/2022 1243   GFRNONAA >60 04/18/2021 0710   GFRAA >60 06/17/2017 1445   Lab Results  Component Value Date   HGBA1C 5.7 (H) 12/04/2022   HGBA1C 6.5 (H) 03/28/2021   Lab Results  Component Value Date   INSULIN 12.0 12/04/2022   Lab Results  Component Value Date   TSH 1.250 12/04/2022   CBC    Component Value Date/Time   WBC 8.6 12/04/2022 1243   WBC 13.0 (H) 04/18/2021 0442   RBC 4.91 12/04/2022 1243   RBC 4.79 04/18/2021 0442   HGB 13.6 12/04/2022 1243   HCT 43.3 12/04/2022 1243   PLT 280 12/04/2022 1243   MCV 88 12/04/2022 1243   MCH 27.7 12/04/2022 1243   MCH 28.0 04/18/2021 0442   MCHC 31.4 (L) 12/04/2022 1243   MCHC 32.1 04/18/2021 0442   RDW 13.9 12/04/2022 1243   Iron Studies No results  found for: "IRON", "TIBC", "FERRITIN", "IRONPCTSAT" Lipid Panel     Component Value Date/Time   CHOL 132 12/04/2022 1243   TRIG 58 12/04/2022 1243   HDL 56 12/04/2022 1243   LDLCALC 64 12/04/2022 1243   Hepatic Function Panel     Component Value Date/Time   PROT 7.4 12/04/2022 1243   ALBUMIN 4.0 12/04/2022 1243   AST 18 12/04/2022 1243   ALT 13 12/04/2022 1243   ALKPHOS 102 12/04/2022 1243   BILITOT 0.3 12/04/2022 1243      Component Value Date/Time   TSH 1.250 12/04/2022 1243   Nutritional Lab Results  Component Value Date   VD25OH 42.9 12/04/2022     Return in about 3 weeks (around 02/19/2023) for For Weight Mangement with Dr. Rikki Spearing, referral to Dr. Dewaine Conger .Marland Kitchen She was informed of the importance of frequent follow up visits to maximize her success with intensive lifestyle modifications for her multiple health conditions.   ATTESTASTION STATEMENTS:  Reviewed by  clinician on day of visit: allergies, medications, problem list, medical history, surgical history, family history, social history, and previous encounter notes.     Worthy Rancher, MD

## 2023-01-29 NOTE — Assessment & Plan Note (Signed)
Patient had upper GI series recently which I reviewed.  Shows normal postoperative changes she does have a small sliding hiatal hernia and is experiencing acid reflux and regurgitation.  This is improved with PPI.  She is scheduled to see bariatric surgeon for additional workup.  She is a poor candidate for incretin therapy as it may worsen her acid reflux and regurgitation.  She also has a restrictive eating pattern.

## 2023-01-29 NOTE — Assessment & Plan Note (Addendum)
Now on PPI symptoms symptoms have improved with increase in strength.  Continue PPI as per bariatric surgeon.

## 2023-01-29 NOTE — Assessment & Plan Note (Signed)
Most recent A1c is 5.7 which is improved from 6.5 a year ago.  She has mild insulin resistance with a Homa IR of 2.16.  Currently on metformin 500 mg XR 1 tablet daily.  We will continue medication for pharmacoprophylaxis.

## 2023-01-29 NOTE — Assessment & Plan Note (Signed)
Patient is overly concerned about increasing calories and getting protein from [employer].  She has been relying on protein shakes post bariatric surgery.  Based on recall her protein consumption and calories is under 1200 cal which is not resulting in weight loss.  She had lost weight when she increased her food intake at the beginning.  I feel she would benefit from cognitive behavioral therapy.  We will refer her to Dr. Dewaine Conger.

## 2023-02-19 ENCOUNTER — Other Ambulatory Visit (INDEPENDENT_AMBULATORY_CARE_PROVIDER_SITE_OTHER): Payer: Self-pay | Admitting: Internal Medicine

## 2023-02-19 DIAGNOSIS — R7303 Prediabetes: Secondary | ICD-10-CM

## 2023-02-20 ENCOUNTER — Encounter (INDEPENDENT_AMBULATORY_CARE_PROVIDER_SITE_OTHER): Payer: Self-pay

## 2023-02-20 ENCOUNTER — Ambulatory Visit (INDEPENDENT_AMBULATORY_CARE_PROVIDER_SITE_OTHER): Payer: 59 | Admitting: Internal Medicine

## 2023-02-21 ENCOUNTER — Encounter (INDEPENDENT_AMBULATORY_CARE_PROVIDER_SITE_OTHER): Payer: Self-pay | Admitting: Family Medicine

## 2023-02-21 ENCOUNTER — Telehealth (INDEPENDENT_AMBULATORY_CARE_PROVIDER_SITE_OTHER): Payer: 59 | Admitting: Family Medicine

## 2023-02-21 DIAGNOSIS — F509 Eating disorder, unspecified: Secondary | ICD-10-CM | POA: Diagnosis not present

## 2023-02-21 DIAGNOSIS — Z6841 Body Mass Index (BMI) 40.0 and over, adult: Secondary | ICD-10-CM

## 2023-02-21 DIAGNOSIS — Z903 Acquired absence of stomach [part of]: Secondary | ICD-10-CM | POA: Diagnosis not present

## 2023-02-21 NOTE — Progress Notes (Signed)
TeleHealth Visit:  This visit was completed with telemedicine (audio/video) technology. Maureen Flores has verbally consented to this TeleHealth visit. The patient is located at home, the provider is located at home. The participants in this visit include the listed provider and patient. The visit was conducted today via MyChart video.  OBESITY Maureen Flores is here to discuss her progress with her obesity treatment plan along with follow-up of her obesity related diagnoses.   Today's visit was # 5 Starting weight: 302 lbs Starting date: 12/04/22 Weight at last in office visit: 306 lbs on 01/29/23 Total weight loss: 0 lbs at last in office visit on 01/29/23. Today's reported weight (02/21/23): none reported   Nutrition Plan: the Category 2 plan  Current exercise:  water aerobics 60 minutes 2 times per week depending on knee pain.   Interim History:  Needs to lose weight to have TKR (goal is 240 lbs) She has severe knee pain after exercise which is preventing more frequent exercise. She struggles with the thought that she is overeating if she has anything but a protein shake. Over the last 3 weeks she is doing better with this and is not drinking any protein shakes. She is eating protein first and having 3 meals per day. She eats very slowly. She finds it difficult to eat meat. She is meal prepping dinner and lunch. Notes sweets cravings but rarely gives into them. She is not tracking calories but focusing on getting 30 gm per meal of protein.  She asked about starting an injection for weight loss and advised that she is not a good candidate because of her restrictive eating habits.  Assessment/Plan:  1.  History of gastric sleeve Surgery performed by Dr. Phylliss Blakes on 04/17/2021.  Top weight was 395 pounds.  Low weight achieved after surgery was 305 pounds. She is working on getting in enough protein and is doing much better. Takes bariatric MVI dailyand sees Dr. Fredricka Bonine twice yearly. Has  had reflux and has esophageal manometry scheduled in Oct. On sucralfate 4 x day and pantoprazole. Better controlled since starting carafate.  Plan: Continue to work on good protein intake. Work on eating all of the food on the cat 2 plan. Chew food 20 times before swallowing. Add moisture to meat using broth if needed.  2. Eating disorder Has history of restrictive eating patterns since bariatric surgery which have contributed to her inability to lose more weight. She is scheduled to see Dr. Dewaine Conger on August 6.  Plan: We had a long discussion about the importance of eating regularly and eating enough protein to maintain good metabolism.  Advised that eating too little perpetuates her issues with her weight. She will see Dr. Dewaine Conger on August 6.  3. Morbid Obesity: Current BMI 52.5  Maureen Flores is currently in the action stage of change. As such, her goal is to continue with weight loss efforts.  She has agreed to the Category 2 plan.  Exercise goals: Suggested that she do water aerobics twice weekly and chair exercises 3 days/week.  Behavioral modification strategies: increasing lean protein intake, no meal skipping, meal planning , and planning for success.  Maureen Flores has agreed to follow-up with our clinic in 4 weeks.  No orders of the defined types were placed in this encounter.   There are no discontinued medications.   No orders of the defined types were placed in this encounter.     Objective:   VITALS: Per patient if applicable, see vitals. GENERAL: Alert and in no acute  distress. CARDIOPULMONARY: No increased WOB. Speaking in clear sentences.  PSYCH: Pleasant and cooperative. Speech normal rate and rhythm. Affect is appropriate. Insight and judgement are appropriate. Attention is focused, linear, and appropriate.  NEURO: Oriented as arrived to appointment on time with no prompting.   Attestation Statements:   Reviewed by clinician on day of visit: allergies, medications,  problem list, medical history, surgical history, family history, social history, and previous encounter notes.  Time spent on visit including the items listed below was 30 minutes.  -preparing to see the patient (e.g., review of tests, history, previous notes) -obtaining and/or reviewing separately obtained history -counseling and educating the patient/family/caregiver -documenting clinical information in the electronic or other health record -ordering medications, tests, or procedures -independently interpreting results and communicating results to the patient/ family/caregiver -referring and communicating with other health care professionals  -care coordination   This was prepared with the assistance of Engineer, civil (consulting).  Occasional wrong-word or sound-a-like substitutions may have occurred due to the inherent limitations of voice recognition software.

## 2023-03-05 ENCOUNTER — Telehealth (INDEPENDENT_AMBULATORY_CARE_PROVIDER_SITE_OTHER): Payer: 59 | Admitting: Psychology

## 2023-03-05 DIAGNOSIS — F5089 Other specified eating disorder: Secondary | ICD-10-CM

## 2023-03-05 NOTE — Progress Notes (Signed)
Office: [phone]  /  Fax: [phone]    Date: March 05, 2023    Appointment Start Time: 12:07pm Duration: 39 minutes Provider: Lawerance Cruel, Psy.D. Type of Session: Intake for Individual Therapy  Location of Patient: Work (private location) Location of Provider: Provider's home (private office) Type of Contact: Telepsychological Visit via MyChart Video Visit  Informed Consent: This provider called Demisha at 12:03pm as she did not present for today's appointment. She indicated she would be joining shortly. As such, today's appointment was initiated 7 minutes late.Prior to proceeding with today's appointment, two pieces of identifying information were obtained. In addition, Maureen Flores's physical location at the time of this appointment was obtained as well a phone number she could be reached at in the event of technical difficulties. Matraca and this provider participated in today's telepsychological service.   The provider's role was explained to Maureen Flores. The provider reviewed and discussed issues of confidentiality, privacy, and limits therein (e.g., reporting obligations). In addition to verbal informed consent, written informed consent for psychological services was obtained prior to the initial appointment. Since the clinic is not a 24/7 crisis center, mental health emergency resources were shared and this  provider explained MyChart, e-mail, voicemail, and/or other messaging systems should be utilized only for non-emergency reasons. This provider also explained that information obtained during appointments will be placed in Eliette's medical record and relevant information will be shared with other providers at [facility] at any locations for coordination of care. Negin agreed information may be shared with other [facility] providers as needed for coordination of care and by signing the service agreement document, she provided written consent for coordination  of care. Prior to initiating telepsychological services, Halleigh completed an informed consent document, which included the development of a safety plan (i.e., an emergency contact and emergency resources) in the event of an emergency/crisis. Verta verbally acknowledged understanding she is ultimately responsible for understanding her insurance benefits for telepsychological and in-person services. This provider also reviewed confidentiality, as it relates to telepsychological services. Twisha  acknowledged understanding that appointments cannot be recorded without both party consent and she is aware she is responsible for securing confidentiality on her end of the session. Amanpreet verbally consented to proceed.  Chief Complaint/HPI: Maureen Flores was referred by Dr. Harriet Masson due to  eating disorder, unspecified type . Per the note for the visit with Dr. Harriet Masson on 01/29/2023, "Patient is overly concerned about increasing calories and getting protein from [employer].  She has been relying on protein shakes post bariatric surgery.  Based on recall her protein consumption and calories is under 1200 cal which is not resulting in weight loss.  She had lost weight when she increased her food intake at the beginning.  I feel she would benefit from cognitive behavioral therapy.  We will refer her to Dr. Dewaine Conger."  During today's appointment, [name] reported, "I don't want to go back to where I came from [as it relates to weight]." She feels she is prescribed "too much" to eat. Despite challenges, she stated she is trying to make sure she is eating regularly as recommended. She also discussed experiencing sugar cravings post bariatric surgery in 2022. She was verbally administered a questionnaire assessing various behaviors related to emotional eating behaviors. [name] endorsed the following: experience food cravings on a regular basis. In addition, [name] denied a history of binge eating behaviors. [name] denied a  history of significantly restricting food intake, purging and engagement in other  compensatory strategies for weight loss, and has never been diagnosed with an eating disorder.  Mental Status Examination:  Appearance: neat Behavior: appropriate to circumstances Mood: neutral Affect: mood congruent Speech: WNL Eye Contact: appropriate Psychomotor Activity: WNL Gait: unable to assess  Thought Process: linear, logical, and goal directed and denies suicidal, homicidal, and self-harm ideation, plan and intent  Thought Content/Perception: no hallucinations, delusions, bizarre thinking or behavior endorsed or observed Orientation: AAOx4 Memory/Concentration: intact Insight/Judgment: fair  Family & Psychosocial History: Maureen Flores reported she is married and she has one adult biological child and one adult step-child. 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 Additionally, Maureen Flores shared her highest level of education obtained is a bachelor's degree. Currently, Maureen Flores's social support system consists of her husband, children, and friends. Moreover, Maureen Flores stated she resides with her husband, son and mother-in-law.   Medical History:  Past Medical History:  Diagnosis Date   Anemia    Arthritis    Asthma    Back pain    Diabetes mellitus without complication (HCC)    type 2   Female infertility    Food allergy    Headache    Hypertension    Joint pain    Migraines    Obesity    Osteoarthritis    Prediabetes    SOB (shortness of breath)    Past Surgical History:  Procedure Laterality Date   CESAREAN SECTION     LAPAROSCOPIC GASTRIC SLEEVE RESECTION N/A 04/17/2021   Procedure: LAPAROSCOPIC GASTRIC SLEEVE RESECTION;  Surgeon: Berna Bue, MD;  Location: [facility];  Service: General;  Laterality: N/A;   UPPER GI ENDOSCOPY N/A 04/17/2021   Procedure: UPPER GI ENDOSCOPY;  Surgeon: Berna Bue, MD;  Location: [facility];   Service: General;  Laterality: N/A;   WISDOM TOOTH EXTRACTION     Current Outpatient Medications on File Prior to Visit  Medication Sig Dispense Refill   albuterol (VENTOLIN HFA) 108 (90 Base) MCG/ACT inhaler Inhale 2 puffs into the lungs every 6 (six) hours as needed for shortness of breath or wheezing.     amLODipine (NORVASC) 10 MG tablet Take 10 mg by mouth daily.     Biotin 1000 MCG CHEW Chew by mouth daily at 12 noon.     Calcium Citrate-Vitamin D (CALCIUM CITRATE + D PO) Take 500 mg by mouth daily at 12 noon.     carvedilol (COREG) 25 MG tablet Take 25 mg by mouth 2 (two) times daily with a meal.     Digestive Enzymes (DIGESTIVE ENZYME PO) Take 150 mg by mouth daily at 12 noon.     DULoxetine (CYMBALTA) 30 MG capsule Take 30 mg by mouth in the morning, at noon, and at bedtime.     ferrous sulfate 325 (65 FE) MG EC tablet Take 325 mg by mouth 3 (three) times daily with meals.     fluticasone (FLONASE) 50 MCG/ACT nasal spray Place 2 sprays into both nostrils daily as needed for allergies or rhinitis.     gabapentin (NEURONTIN) 800 MG tablet Take 800 mg by mouth 3 (three) times daily.     losartan (COZAAR) 100 MG tablet Take 0.5 tablets (50 mg total) by mouth daily. 30 tablet 0   losartan-hydrochlorothiazide (HYZAAR) 100-25 MG tablet Take 1 tablet by mouth daily. (Patient not taking: Reported on 01/29/2023)     metFORMIN (GLUCOPHAGE-XR) 500 MG 24 hr tablet Take 1 tablet (500 mg total) by mouth daily  with breakfast. 30 tablet 0   montelukast (SINGULAIR) 10 MG tablet Take 10 mg by mouth at bedtime.     Multiple Vitamins-Minerals (BARIATRIC MULTIVITAMINS/IRON PO) Take by mouth daily at 12 noon. With 45 mg iron     naphazoline-pheniramine (NAPHCON-A) 0.025-0.3 % ophthalmic solution Place 1 drop into both eyes 4 (four) times daily as needed for eye irritation.     ondansetron (ZOFRAN ODT) 4 MG disintegrating tablet Take 1 tablet (4 mg total) by mouth every 8 (eight) hours as needed for nausea or  vomiting. 20 tablet 5   ondansetron (ZOFRAN-ODT) 4 MG disintegrating tablet Dissolve 1 tablet by mouth every 6 (six) hours as needed for nausea or vomiting. 20 tablet 0   pantoprazole (PROTONIX) 40 MG tablet Take 50 mg by mouth daily.     rizatriptan (MAXALT-MLT) 10 MG disintegrating tablet Take 1 tablet (10 mg total) by mouth as needed for migraine (May repeat in 2 hours.  Max 2 tablets in 24 hours). May repeat in 2 hours if needed 10 tablet 5   simvastatin (ZOCOR) 10 MG tablet Take 10 mg by mouth daily.     sucralfate (CARAFATE) 1 g tablet Take 1 g by mouth 4 (four) times daily.     topiramate (TOPAMAX) 50 MG tablet Take 1 tablet (50 mg total) by mouth at bedtime. Take 1/2 tablet at bedtime for one week, then increase to 1 tablet at bedtime (Patient taking differently: Take 25 mg by mouth 2 (two) times daily.) 30 tablet 5   traMADol (ULTRAM) 50 MG tablet Take 1 tablet (50 mg total) by mouth every 12 (twelve) hours as needed (pain). 20 tablet 0   triamcinolone (KENALOG) 0.025 % cream Apply 1 application topically daily.     triamcinolone lotion (KENALOG) 0.1 % Apply 1 application  topically once a week.     triamterene-hydrochlorothiazide (MAXZIDE-25) 37.5-25 MG tablet Take 1 tablet by mouth daily.     No current facility-administered medications on file prior to visit.  Cozette stated she is medication compliant.   Mental Health History: Loana reported she completed a psychological evaluation for bariatric surgery in 2022. She shared her PCP prescribed Cymbalta for pain, but she is not currently taking it due to weight gain concerns per her conversation with Dr. Rikki Spearing. She stated her PCP is aware. Rubyann reported there is no history of hospitalizations for psychiatric concerns. 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   Lynda described her typical mood lately as "fine" in personal life, but "work life is stressful" due to being short staffed. Loris denied current alcohol use. She denied tobacco use. She denied illicit/recreational substance use. Furthermore, Stpehanie indicated she is not experiencing the following: hallucinations and delusions, paranoia, symptoms of mania , social withdrawal, crying spells, panic attacks, memory concerns, and obsessions and compulsions. She also denied history of and current suicidal ideation, plan, and intent; history of and current homicidal ideation, plan, and intent; and history of and current engagement in self-harm.  Legal History: Crimson reported there is no history of legal involvement.   Structured Assessments Results: The Patient Health Questionnaire-9 (PHQ-9) is a self-report measure that assesses symptoms and severity of depression over the course of the last two weeks. Ardelle obtained a score of 2 suggesting minimal depression. Aleysha finds the endorsed symptoms to  be not difficult at all. [0= Not at all; 1= Several days; 2= More than half the days; 3= Nearly every day] Little interest or pleasure in doing things 0  Feeling down, depressed, or hopeless 0  Trouble falling or staying asleep, or sleeping too much 0  Feeling tired or having little energy 2  Poor appetite or overeating 0  Feeling bad about yourself --- or that you are a failure or have let yourself or your family down 0  Trouble concentrating on things, such as reading the newspaper or watching television 0  Moving or speaking so slowly that other people could have noticed? Or the opposite --- being so fidgety or restless that you have been moving around a lot more than usual 0  Thoughts that you would be better off dead or hurting yourself in some way 0  PHQ-9 Score 2    The Generalized Anxiety Disorder-7 (GAD-7) is a brief self-report measure that assesses symptoms of  anxiety over the course of the last two weeks. Nyanna obtained a score of 0. [0= Not at all; 1= Several days; 2= Over half the days; 3= Nearly every day] Feeling nervous, anxious, on edge 0  Not being able to stop or control worrying 0  Worrying too much about different things 0  Trouble relaxing 0  Being so restless that it's hard to sit still 0  Becoming easily annoyed or irritable 0  Feeling afraid as if something awful might happen 0  GAD-7 Score 0   Interventions:  Conducted a chart review Focused on rapport building Verbally administered PHQ-9 and GAD-7 for symptom monitoring Verbally administered Food & Mood questionnaire to assess various behaviors related to emotional eating Provided emphatic reflections and validation Psychoeducation provided regarding physical versus emotional hunger Psychoeducation provided regarding the consequences of not eating regularly  Diagnostic Impressions & Provisional DSM-5 Diagnosis(es): Maizee reported she had bariatric surgery in 2022 and described experiencing sweet cravings since surgery. Due to concern regarding gaining weight, she acknowledged she does not eat enough or regularly; however, she is starting to increase food intake and frequency. She denied engagement in any other disordered eating behaviors. Based on the aforementioned, the following diagnosis was assigned: F50.89 Other Specified Feeding or Eating Disorder, Infrequent Food Intake.  Plan: Elaina appears able and willing to participate as evidenced by engagement in reciprocal conversation and asking questions as needed for clarification. The next appointment is scheduled for 04/02/2023 at 8am, which will be via MyChart Video Visit. The following treatment goal was established: increase coping skills. This provider will regularly review the treatment plan and medical chart to keep informed of status changes. Danaysha expressed understanding and agreement with the initial treatment plan of  care. Shelia will be sent a handout via e-mail to utilize between now and the next appointment to increase awareness of hunger patterns and subsequent eating. Elvy provided verbal consent during today's appointment for this provider to send the handout via e-mail. Additionally, she was also encouraged to continue eating smaller/frequent meals/snacks.

## 2023-03-19 ENCOUNTER — Ambulatory Visit (INDEPENDENT_AMBULATORY_CARE_PROVIDER_SITE_OTHER): Payer: 59 | Admitting: Internal Medicine

## 2023-03-19 ENCOUNTER — Encounter (INDEPENDENT_AMBULATORY_CARE_PROVIDER_SITE_OTHER): Payer: Self-pay | Admitting: Internal Medicine

## 2023-03-19 VITALS — BP 134/82 | HR 62 | Temp 98.2°F | Ht 64.0 in | Wt 306.0 lb

## 2023-03-19 DIAGNOSIS — Z6841 Body Mass Index (BMI) 40.0 and over, adult: Secondary | ICD-10-CM

## 2023-03-19 DIAGNOSIS — R7303 Prediabetes: Secondary | ICD-10-CM | POA: Diagnosis not present

## 2023-03-19 DIAGNOSIS — F509 Eating disorder, unspecified: Secondary | ICD-10-CM

## 2023-03-19 DIAGNOSIS — Z903 Acquired absence of stomach [part of]: Secondary | ICD-10-CM

## 2023-03-19 MED ORDER — METFORMIN HCL ER 500 MG PO TB24: 500.0000 mg | ORAL_TABLET | Freq: Every day | ORAL | 0 refills | Status: DC

## 2023-03-19 NOTE — Assessment & Plan Note (Addendum)
I have reviewed consultation from Dr. Dewaine Conger.  I had referred her for cognitive behavioral therapy as patient was overly concerned about increasing calories and getting protein from [employer].  She had been relying on protein shakes post bariatric surgery.  She has now been introducing more whole foods and feels more confident about her nutrition.  Continue to work on implementation of nutritional plan.  Follow-up with Dr. Dewaine Conger as scheduled.

## 2023-03-19 NOTE — Assessment & Plan Note (Signed)
Peak weight was 435 her nadir was 301.  Patient had upper GI series recently which I reviewed.  Shows normal postoperative changes she does have a small sliding hiatal hernia and is experiencing acid reflux and regurgitation which has improved on PPI.

## 2023-03-19 NOTE — Assessment & Plan Note (Signed)
Most recent A1c is 5.7 which is improved from 6.5 a year ago.  She has mild insulin resistance with a Homa IR of 2.16.  Currently on metformin 500 mg XR 1 tablet daily.  We will continue medication for pharmacoprophylaxis.

## 2023-03-19 NOTE — Progress Notes (Signed)
Office: [phone]  /  Fax: [phone]  WEIGHT SUMMARY AND BIOMETRICS  Vitals Temp: 98.2 F (36.8 C) BP: 134/82 Pulse Rate: 62 SpO2: 99 %   Anthropometric Measurements Height: 5\' 4"  (1.626 m) Weight: (!) 306 lb (138.8 kg) BMI (Calculated): 52.5 Weight at Last Visit: 309 lb Weight Lost Since Last Visit: 0 lb Weight Gained Since Last Visit: 0 lb Starting Weight: 302 lb Total Weight Loss (lbs): 0 lb (0 kg) Peak Weight: 435 lb   Body Composition  Body Fat %: 64 % Fat Mass (lbs): 196.2 lbs Muscle Mass (lbs): 104.8 lbs Visceral Fat Rating : 25    No data recorded Today's Visit #: 5  Starting Date: 12/04/22   HPI  Chief Complaint: OBESITY  Maureen Flores is here to discuss her progress with her obesity treatment plan. She is on the the Category 2 Plan and states she is following her eating plan approximately 100 % of the time. She states she is exercising 5-10 minutes 3 times per week.  Interval History:  Since last office visit she has maintained.  BIA information suggest a decrease in body fat, improvement in visceral fat and gains in muscle mass.  She has been working hard on increasing the frequency of her meals and getting recommended amount of protein.  She has also noticed now having hunger signals.  These had been absent for quite some time following gastric bypass surgery.  She still notes restriction when eating.  She has been seen Dr. Dewaine Conger virtually and is working on cognitive restructuring around her adversity to increasing calories out of concerns of gaining weight.  Patient has self discontinued gabapentin out of concerns of weight gain associated with medication.  She does note increasing pain involving her lower back and legs.  Orixegenic Control: Denies problems with appetite and hunger signals.  Denies problems with satiety and satiation.  Denies problems with eating patterns and portion control.  Denies abnormal cravings. Denies feeling deprived or  restricted.   Barriers identified: low volume of physical acitivity, orthopedic problems, medical conditions or chronic pain affecting mobility, and metabolic adaptations associated with gastric bypass. .   Pharmacotherapy for weight loss: She is currently taking no anti-obesity medication.    ASSESSMENT AND PLAN  TREATMENT PLAN FOR OBESITY:  Recommended Dietary Goals  Maureen Flores is currently in the action stage of change. As such, her goal is to continue weight management plan. She has agreed to: continue current plan  Behavioral Intervention  We discussed the following Behavioral Modification Strategies today: increasing lean protein intake, decreasing simple carbohydrates , increasing vegetables, increasing lower glycemic fruits, increasing water intake, continue to practice mindfulness when eating, planning for success, and continue to follow-up with Dr. Dewaine Conger .  Additional resources provided today: None  Recommended Physical Activity Goals  Maureen Flores has been advised to work up to 150 minutes of moderate intensity aerobic activity a week and strengthening exercises 2-3 times per week for cardiovascular health, weight loss maintenance and preservation of muscle mass.   She has agreed to :  She enjoys aqua aerobics and is planning to return to the aquatic center in [place] to begin exercise program.  I would also like for her to increase strengthening exercises.  Pharmacotherapy We discussed various medication options to help Maureen Flores with her weight loss efforts and we both agreed to : continue with nutritional and behavioral strategies  ASSOCIATED CONDITIONS ADDRESSED TODAY  H/O gastric sleeve (04/17/21) Assessment & Plan: Peak weight was 435 her nadir was 301.  Patient had upper GI series recently which I reviewed.  Shows normal postoperative changes she does have a small sliding hiatal hernia and is experiencing acid reflux and regurgitation which has improved on PPI.   Eating  disorder, unspecified type Assessment & Plan: I have reviewed consultation from Dr. Dewaine Conger.  I had referred her for cognitive behavioral therapy as patient was overly concerned about increasing calories and getting protein from [employer].  She had been relying on protein shakes post bariatric surgery.  She has now been introducing more whole foods and feels more confident about her nutrition.  Continue to work on implementation of nutritional plan.  Follow-up with Dr. Dewaine Conger as scheduled.   Class 3 severe obesity with serious comorbidity and body mass index (BMI) of 50.0 to 59.9 in adult, unspecified obesity type [facility]) Assessment & Plan: Maureen Flores has been working hard on changing nutritional patterns.  She was restricting nutrition out of concerns of weight regain following gastric bypass.  Her BIA measurements today suggests favorable changes in body composition.  I would like for her to continue with current nutrition plan.  She is also will be starting aqua aerobics and has been working with Dr. Dewaine Conger on cognitive restructuring.  At present time she does not benefit from antiobesity medications as she has adequate Orixegenic control    Prediabetes Assessment & Plan: Most recent A1c is 5.7 which is improved from 6.5 a year ago.  She has mild insulin resistance with a Homa IR of 2.16.  Currently on metformin 500 mg XR 1 tablet daily.  We will continue medication for pharmacoprophylaxis.  Orders: -     metFORMIN HCl ER; Take 1 tablet (500 mg total) by mouth daily with breakfast.  Dispense: 30 tablet; Refill: 0    PHYSICAL EXAM:  Blood pressure 134/82, pulse 62, temperature 98.2 F (36.8 C), height 5\' 4"  (1.626 m), weight (!) 306 lb (138.8 kg), SpO2 99%. Body mass index is 52.52 kg/m.  General: She is overweight, cooperative, alert, well developed, and in no acute distress. PSYCH: Has normal mood, affect and thought process.   HEENT: EOMI, sclerae are anicteric. Lungs: Normal breathing  effort, no conversational dyspnea. Extremities: No edema.  Neurologic: No gross sensory or motor deficits. No tremors or fasciculations noted.    DIAGNOSTIC DATA REVIEWED:  BMET    Component Value Date/Time   NA 144 12/04/2022 1243   K 4.3 12/04/2022 1243   CL 102 12/04/2022 1243   CO2 23 12/04/2022 1243   GLUCOSE 73 12/04/2022 1243   GLUCOSE 138 (H) 04/18/2021 0710   BUN 20 12/04/2022 1243   CREATININE 0.93 12/04/2022 1243   CALCIUM 9.4 12/04/2022 1243   GFRNONAA >60 04/18/2021 0710   GFRAA >60 06/17/2017 1445   Lab Results  Component Value Date   HGBA1C 5.7 (H) 12/04/2022   HGBA1C 6.5 (H) 03/28/2021   Lab Results  Component Value Date   INSULIN 12.0 12/04/2022   Lab Results  Component Value Date   TSH 1.250 12/04/2022   CBC    Component Value Date/Time   WBC 8.6 12/04/2022 1243   WBC 13.0 (H) 04/18/2021 0442   RBC 4.91 12/04/2022 1243   RBC 4.79 04/18/2021 0442   HGB 13.6 12/04/2022 1243   HCT 43.3 12/04/2022 1243   PLT 280 12/04/2022 1243   MCV 88 12/04/2022 1243   MCH 27.7 12/04/2022 1243   MCH 28.0 04/18/2021 0442   MCHC 31.4 (L) 12/04/2022 1243   MCHC 32.1 04/18/2021 0442  RDW 13.9 12/04/2022 1243   Iron Studies No results found for: "IRON", "TIBC", "FERRITIN", "IRONPCTSAT" Lipid Panel     Component Value Date/Time   CHOL 132 12/04/2022 1243   TRIG 58 12/04/2022 1243   HDL 56 12/04/2022 1243   LDLCALC 64 12/04/2022 1243   Hepatic Function Panel     Component Value Date/Time   PROT 7.4 12/04/2022 1243   ALBUMIN 4.0 12/04/2022 1243   AST 18 12/04/2022 1243   ALT 13 12/04/2022 1243   ALKPHOS 102 12/04/2022 1243   BILITOT 0.3 12/04/2022 1243      Component Value Date/Time   TSH 1.250 12/04/2022 1243   Nutritional Lab Results  Component Value Date   VD25OH 42.9 12/04/2022     Return in about 3 weeks (around 04/09/2023) for For Weight Mangement with Dr. Rikki Spearing.Marland Kitchen She was informed of the importance of frequent follow up visits to  maximize her success with intensive lifestyle modifications for her multiple health conditions.   ATTESTASTION STATEMENTS:  Reviewed by clinician on day of visit: allergies, medications, problem list, medical history, surgical history, family history, social history, and previous encounter notes.   I have spent 40 minutes in the care of the patient today including: preparing to see patient (e.g. review and interpretation of tests, old notes ), obtaining and/or reviewing separately obtained history, performing a medically appropriate examination or evaluation, counseling and educating the patient, ordering medications, test or procedures, and documenting clinical information in the electronic or other health care record   Worthy Rancher, MD

## 2023-03-19 NOTE — Assessment & Plan Note (Signed)
Maureen Flores has been working hard on changing nutritional patterns.  She was restricting nutrition out of concerns of weight regain following gastric bypass.  Her BIA measurements today suggests favorable changes in body composition.  I would like for her to continue with current nutrition plan.  She is also will be starting aqua aerobics and has been working with Dr. Dewaine Flores on cognitive restructuring.  At present time she does not benefit from antiobesity medications as she has adequate Orixegenic control

## 2023-04-02 ENCOUNTER — Telehealth (INDEPENDENT_AMBULATORY_CARE_PROVIDER_SITE_OTHER): Payer: 59 | Admitting: Psychology

## 2023-04-02 ENCOUNTER — Telehealth (INDEPENDENT_AMBULATORY_CARE_PROVIDER_SITE_OTHER): Payer: Self-pay | Admitting: Psychology

## 2023-04-02 NOTE — Progress Notes (Unsigned)
  Office: [phone]  /  Fax: [phone]    Date: April 02, 2023  Appointment Start Time: *** Duration: *** minutes Provider: Lawerance Cruel, Psy.D. Type of Session: Individual Therapy  Location of Patient: {gbptloc:23249} (private location) Location of Provider: Provider's Home (private office) Type of Contact: Telepsychological Visit via MyChart Video Visit  Session Content: This provider called Maureen Flores at 8:03am as she did not present for today's appointment. A HIPAA compliant voicemail was left requesting a call back. As such, today's appointment was initiated *** minutes late.Maureen Flores is a 52 y.o. female presenting for a follow-up appointment to address the previously established treatment goal of increasing coping skills.Today's appointment was a telepsychological visit. Maureen Flores provided verbal consent for today's telepsychological appointment and she is aware she is responsible for securing confidentiality on her end of the session. Prior to proceeding with today's appointment, Maureen Flores's physical location at the time of this appointment was obtained as well a phone number she could be reached at in the event of technical difficulties. Maureen Flores and this provider participated in today's telepsychological service.   This provider conducted a brief check-in. *** Maureen Flores was receptive to today's appointment as evidenced by openness to sharing, responsiveness to feedback, and {gbreceptiveness:23401}.  Mental Status Examination:  Appearance: {Appearance:22431} Behavior: {Behavior:22445} Mood: {gbmood:21757} Affect: {Affect:22436} Speech: {Speech:22432} Eye Contact: {Eye Contact:22433} Psychomotor Activity: {Motor Activity:22434} Gait: {gbgait:23404} Thought Process: {thought process:22448}  Thought Content/Perception: {disturbances:22451} Orientation: {Orientation:22437} Memory/Concentration: {gbcognition:22449} Insight: {Insight:22446} Judgment: {Insight:22446}  Interventions:   {Interventions for Progress Notes:23405}  DSM-5 Diagnosis(es):  F50.89 Other Specified Feeding or Eating Disorder, Infrequent Food Intake  Treatment Goal & Progress: During the initial appointment with this provider, the following treatment goal was established: increase coping skills. Maureen Flores has demonstrated progress in her goal as evidenced by {gbtxprogress:22839}. Maureen Flores also {gbtxprogress2:22951}.  Plan: The next appointment is scheduled for *** at ***, which will be via MyChart Video Visit. The next session will focus on {Plan for Next Appointment:23400}.

## 2023-04-02 NOTE — Telephone Encounter (Signed)
  Office: [phone]  /  Fax: [phone]  Date of Call: April 02, 2023  Time of Call: 8:03am Provider: Lawerance Cruel, PsyD  CONTENT: This provider called Palyn to check-in as she did not present for today's MyChart Video Visit appointment. A HIPAA compliant voicemail was left requesting a call back. Of note, this provider stayed on the MyChart Video Visit appointment for 5 minutes prior to signing off per the clinic's grace period policy.    PLAN: This provider will wait for Shalyn to call back. No further follow-up planned by this provider.

## 2023-04-30 ENCOUNTER — Ambulatory Visit (INDEPENDENT_AMBULATORY_CARE_PROVIDER_SITE_OTHER): Payer: 59 | Admitting: Internal Medicine

## 2023-05-01 ENCOUNTER — Telehealth (INDEPENDENT_AMBULATORY_CARE_PROVIDER_SITE_OTHER): Payer: Self-pay | Admitting: Internal Medicine

## 2023-05-01 NOTE — Telephone Encounter (Signed)
10/2 attempted to call patient to reschedule her 04/30/23  appointment. No answer to phone left patient a voice mail asking her to call us to reschedule   Thank you

## 2023-05-15 ENCOUNTER — Encounter (HOSPITAL_COMMUNITY): Payer: Self-pay | Admitting: Gastroenterology

## 2023-05-15 ENCOUNTER — Ambulatory Visit (HOSPITAL_COMMUNITY)
Admission: RE | Admit: 2023-05-15 | Discharge: 2023-05-15 | Disposition: A | Discharge status: Home / Self Care | Payer: 59 | Source: Ambulatory Visit | Attending: Gastroenterology | Admitting: Gastroenterology

## 2023-05-15 ENCOUNTER — Encounter (HOSPITAL_COMMUNITY)
Admission: RE | Disposition: A | Discharge status: Home / Self Care | Payer: Self-pay | Source: Ambulatory Visit | Attending: Gastroenterology

## 2023-05-15 DIAGNOSIS — K219 Gastro-esophageal reflux disease without esophagitis: Secondary | ICD-10-CM | POA: Diagnosis not present

## 2023-05-15 DIAGNOSIS — R112 Nausea with vomiting, unspecified: Secondary | ICD-10-CM

## 2023-05-15 DIAGNOSIS — K2289 Other specified disease of esophagus: Secondary | ICD-10-CM | POA: Diagnosis present

## 2023-05-15 DIAGNOSIS — R12 Heartburn: Secondary | ICD-10-CM

## 2023-05-15 HISTORY — PX: 24 HOUR PH STUDY: SHX5419

## 2023-05-15 HISTORY — PX: ESOPHAGEAL MANOMETRY: SHX5429

## 2023-05-15 SURGERY — MANOMETRY, ESOPHAGUS
Anesthesia: Choice

## 2023-05-15 MED ORDER — LIDOCAINE VISCOUS HCL 2 % MT SOLN
OROMUCOSAL | Status: AC
  Filled 2023-05-15: qty 15

## 2023-05-15 SURGICAL SUPPLY — 2 items
FACESHIELD LNG OPTICON STERILE (SAFETY) IMPLANT
GLOVE BIO SURGEON STRL SZ8 (GLOVE) ×2 IMPLANT

## 2023-05-15 NOTE — Progress Notes (Signed)
Esophageal Manometry done per protocol. Pt tolerated well with out complication. Ph with impedance done per protocol. Pt tolerated well. Instructions given regarding the study and monitor. Pt verbalized understand and return demonstrated use of monitor. Pt will return tomorrow to have probe removed and monitor downloaded.   Patient has been off PPIs since Sunday and will continue to be off PPi for study.

## 2023-05-19 ENCOUNTER — Encounter (HOSPITAL_COMMUNITY): Payer: Self-pay | Admitting: Gastroenterology

## 2023-05-22 NOTE — Plan of Care (Signed)
[facility] Tonsillectomy/Adenoidectomy, Postoperative PEDS care plan entered in error.

## 2023-05-30 ENCOUNTER — Ambulatory Visit: Payer: Self-pay | Admitting: Surgery

## 2023-05-30 DIAGNOSIS — R112 Nausea with vomiting, unspecified: Secondary | ICD-10-CM

## 2023-05-30 DIAGNOSIS — K219 Gastro-esophageal reflux disease without esophagitis: Secondary | ICD-10-CM

## 2023-05-30 DIAGNOSIS — R12 Heartburn: Secondary | ICD-10-CM

## 2023-05-30 NOTE — H&P (Signed)
Maureen Flores [id number]   Referring Provider:  Lavella Hammock, *   Subjective   Chief Complaint: Follow-up (RETURN WEIGHT LOSS )     History of Present Illness:  She has continued to suffer tremendously with heartburn.  This is despite changing her eating patterns, sleeping propped up on pillows, Carafate, PPI.  Often has nocturnal regurgitation, at least 2 times a week.  Symptoms are aggravated even by drinking water and in fact this morning she has had nothing to eat and is quite uncomfortable.  Had an upper GI done in May that did show a tiny sliding hiatal hernia and mild reflux. pH probe with a DeMeester score of 26.4, overall report with evidence of significant gastroesophageal acid reflux Manometry with normal relaxation of the GE junction hypercontractile esophagus likely related to her reflux   Last visit, May of this year: follows up today after recent issues with heartburn.  She has been regaining weight.  Saw healthy weight and wellness yesterday and was back up to 318 pounds which is up about 8 pounds since I last saw her in October.  She had started gabapentin which may have been contributing.  She has been advised to track calories and has been given a goal of 1200 cal a day with 90 g of protein. She had communicated to [place] in mid March that she felt food was getting stuck in her chest no matter how much she chewed or how slow she was eating; also reported that food is pushing back up into her esophagus with some burning at the top, noted some gagging at night with substance in her throat and esophagus which have been going on since the beginning of March.  We advised to go back to liquids only for a few days and ordered an upper GI which did show a tiny hiatal hernia and mild reflux with her lying prone, but nothing severe.  We recommended keeping her on Protonix 40 mg daily, antireflux eating style (eat slowly- bites the size of a thumbnail, chew until applesauce  consistency, wait 20 seconds between bites; smaller more frequent meals, try to stand up and walk for 10 minutes after each meal); nothing to eat within 3 hours of bedtime); consider sleeping propped up on a few pillows.  Despite dietary changes, PPI and positioning changes she has continued to have daily reflux.  Has a burning sensation in her mid and upper esophageal region as well as occasional sensation of food sitting heavily in the epigastric region.  Also has been having worse pain in her hips and knees attributed to change in her gait from the weight loss she has had.  Last visit, October 2023: She is now a little over a year s/p sleeve. Overall she is doing well, but her weight loss has stalled. Her knees are still a big issue and she is getting steroid injections periodically until she can get down to 240 which is her orthopedic surgeon's bmi cutoff for TKR. She is having hunger pains and less restriction, but feels like she is still eating mostly on track. Not tracking her protein or calories per se. Energy level is high, and notes that her sleep is poor- some times she will stay up over 24 hours because she just doesn't feel tired.   Last visit: She is about 6 months status post sleeve.  At her last visit in November complained of fatigue and palpitations, struggling with most solid proteins and only really tolerating seafood,  not really meeting her protein and hydration goals but working hard to do so.  Was referred to cardiology but did not end up having a consultation and meant to follow-up with me in December.  She states she had her second cycle since surgery in December and it was essentially debilitating, she saw her gynecologist which was reassuring.  Since then she has had significant improvement.  She is still working to see what she tolerates as far as proteins go, using protein shakes as needed and doing better at meeting her goals.  She is able to complete her workouts although still  has some fatigue she does know her energy level is better.   Review of Systems: A complete review of systems was obtained from the patient.  I have reviewed this information and discussed as appropriate with the patient.  See HPI as well for other ROS.   Medical History: Past Medical History:  Diagnosis Date   Arthritis    Asthma, unspecified asthma severity, unspecified whether complicated, unspecified whether persistent (HHS-HCC)    Diabetes mellitus without complication (CMS/HHS-HCC)    Hypertension     There is no problem list on file for this patient.   Past Surgical History:  Procedure Laterality Date   LAPAROSCOPIC GASTRIC SLEEVE RESECTION  04/17/2021     No Known Allergies  Current Outpatient Medications on File Prior to Visit  Medication Sig Dispense Refill   albuterol 90 mcg/actuation inhaler albuterol sulfate HFA 90 mcg/actuation aerosol inhaler  INHALE 2 PUFFS INTO THE LUNGS EVERY 6 HOURS AS NEEDED FOR WHEEZING     amLODIPine (NORVASC) 10 MG tablet amlodipine 10 mg tablet  TAKE 1 TABLET BY MOUTH EVERY DAY     carvediloL (COREG) 25 MG tablet Take by mouth     DULoxetine (CYMBALTA) 30 MG DR capsule duloxetine 30 mg capsule,delayed release     fluticasone propionate (FLONASE) 50 mcg/actuation nasal spray fluticasone propionate 50 mcg/actuation nasal spray,suspension  SHAKE LIQUID AND USE 2 SPRAYS IN EACH NOSTRIL DAILY     FUROsemide (LASIX) 20 MG tablet furosemide 20 mg tablet     gabapentin (NEURONTIN) 800 MG tablet gabapentin 800 mg tablet  TAKE 1 TABLET BY MOUTH THREE TIMES A DAY (Patient not taking: Reported on 12/19/2022)     losartan (COZAAR) 50 MG tablet Take 1 tablet (50 mg total) by mouth once daily 30 tablet 11   metFORMIN (GLUCOPHAGE-XR) 500 MG XR tablet metformin ER 500 mg tablet,extended release 24 hr     montelukast (SINGULAIR) 10 mg tablet montelukast 10 mg tablet     pioglitazone (ACTOS) 30 MG tablet pioglitazone 30 mg tablet (Patient not taking:  Reported on 12/19/2022)     simvastatin (ZOCOR) 10 MG tablet simvastatin 10 mg tablet     sucralfate (CARAFATE) 1 gram tablet Take 1 tablet (1 g total) by mouth 4 (four) times daily before meals and nightly 120 tablet 0   topiramate (TOPAMAX) 50 MG tablet Take by mouth     triamcinolone 0.025 % cream triamcinolone acetonide 0.025 % topical cream     triamterene-hydrochlorothiazide (MAXZIDE-25) 37.5-25 mg tablet triamterene 37.5 mg-hydrochlorothiazide 25 mg tablet  TAKE 1 TABLET BY MOUTH EVERY DAY (Patient not taking: Reported on 12/19/2022)     No current facility-administered medications on file prior to visit.    No family history on file.   Social History   Tobacco Use  Smoking Status Never  Smokeless Tobacco Never     Social History   Socioeconomic History  Marital status: Married  Tobacco Use   Smoking status: Never   Smokeless tobacco: Never  Vaping Use   Vaping status: Never Used  Substance and Sexual Activity   Alcohol use: Yes   Drug use: Defer   Sexual activity: Defer    Objective:    Vitals:   05/30/23 1148  BP: (!) 155/85  Pulse: 76  Temp: 36.1 C (97 F)  Weight: (!) 140.8 kg (310 lb 6.4 oz)  Height: 165.1 cm (5\' 5" )      Body mass index is 51.65 kg/m.  Alert and well-appearing Unlabored respirations  Assessment and Plan:  Diagnoses and all orders for this visit:  Gastroesophageal reflux disease, unspecified whether esophagitis present  History of sleeve gastrectomy  Morbid obesity (CMS/HHS-HCC)    Unfortunately has developed fairly refractory reflux which has been ongoing for several months now.   No significant improvement with PPI, Carafate, Reglan, behavioral/eating changes.  Symptoms correlate with positive pH probe. At this point I think her best option is conversion to Roux-en-Y configuration to treat her reflux.  I discussed the surgery with her and we discussed risks including bleeding, infection, pain, scarring, injury to  intra-abdominal structures, anastomotic bleed/leak/stricture, marginal ulcer, internal hernia, nutritional deficiency, chronic abdominal pain or nausea, and discussed that this is not anticipated to induce a tremendous amount of further weight loss.  We also discussed cardiovascular/pulmonary/thromboembolic risks.  Questions were welcomed and answered to her satisfaction.  She is agreeable to proceed.    I do appreciate the care she is getting at the obesity medicine clinic and would encourage that to continue as I suspect she will continue to need adjuvant medication therapy even if she does get converted to a gastric bypass.    Day of surgery 04/17/21: 389.6lb 1 month: 374.6lb 2 Months: 366.4lb 6 month: 332lb 05/16/22: 310 05/30/23: 310.4 BMI 30: 180lb Chloe Baig Carlye Grippe, MD

## 2023-06-03 ENCOUNTER — Encounter (INDEPENDENT_AMBULATORY_CARE_PROVIDER_SITE_OTHER): Payer: Self-pay | Admitting: Internal Medicine

## 2023-06-21 ENCOUNTER — Encounter: Payer: Self-pay | Admitting: Dietician

## 2023-06-21 ENCOUNTER — Encounter: Payer: 59 | Attending: Surgery | Admitting: Dietician

## 2023-06-21 VITALS — Ht 65.0 in | Wt 315.4 lb

## 2023-06-21 DIAGNOSIS — Z6841 Body Mass Index (BMI) 40.0 and over, adult: Secondary | ICD-10-CM | POA: Diagnosis not present

## 2023-06-21 DIAGNOSIS — K219 Gastro-esophageal reflux disease without esophagitis: Secondary | ICD-10-CM | POA: Diagnosis not present

## 2023-06-21 DIAGNOSIS — E669 Obesity, unspecified: Secondary | ICD-10-CM

## 2023-06-21 DIAGNOSIS — Z713 Dietary counseling and surveillance: Secondary | ICD-10-CM | POA: Insufficient documentation

## 2023-06-21 NOTE — Progress Notes (Signed)
Nutrition Assessment for Bariatric Surgery: Pre-Surgery Behavioral and Nutrition Intervention Program   Medical Nutrition Therapy  Appt Start Time: 9:47    End Time: 10:50  Patient was seen on 06/21/2023 for Pre-Operative Nutrition Assessment. Purpose of todays visit  enhance perioperative outcomes along with a healthy weight maintenance   Referral stated Supervised Weight Loss (SWL) visits needed: 0  Pt completed visits.   Pt has cleared nutrition requirements.   Planned surgery: Revision from Sleeve to RYGB Pt expectation of surgery: get rid of GERD   NUTRITION ASSESSMENT   Anthropometrics  Start weight at [facility]: 315.4 lbs (date: 06/21/2023)  Height: 65 in BMI: 52.49 kg/m2     Clinical   Medical hx: HTN, GERD, asthma, obesity Medications: metformin, losarten, gabapentin,   Labs: A1c 5.7;  Notable signs/symptoms: none noted Any previous deficiencies? No  Evaluation of Nutritional Deficiencies: Micronutrient Nutrition Focused Physical Exam: Hair: No issues observed Eyes: No issues observed Mouth: No issues observed Neck: No issues observed Nails: No issues observed Skin: No issues observed  Lifestyle & Dietary Hx  Pt states GERD is bad, stating she is suffering and choking in her sleep. Pt states she stopped eating due to GERD and started gaining weight. Pt states may be due to medication. Pt states she was doing water aerobics and injured her knee. Pt states she had oatmeal and it made her nauseous, stating she was having a GERD episode. Pt states she works from home. Pt states she has bowel movements about once a day, stating she had more before surgery.  Current Physical Activity Recommendations state 150 minutes per week of moderate to vigorous movement including Cardio and 1-2 days of resistance activities as well as flexibility/balance activities:  Pts current physical activity: ADLs, with 0% recommendation reached   Sleep Hygiene: duration and quality: less  good nights than bad, stating the reflux is bad.  Current Patient Perceived Stress Level as stated by pt on a scale of 1-10:  4       Stress Management Techniques: breathing, step away from the stress  According to the Dietary Guidelines for Americans Recommendation: equivalent 1.5-2 cups fruits per day, equivalent 2-3 cups vegetables per day and at least half all grains whole  Fruit servings per day (on average): 0-1, meeting 0-50% recommendation  Non-starchy vegetable servings per day (on average): 2, meeting 66-100% recommendation  Whole Grains per day (on average): 1  Number of meals missed/skipped per week out of 21: 7  24-Hr Dietary Recall First Meal: oatmeal or grits or yogurt and granola (protein granola) or cereal with almond milk or fairlife milk Snack:  Second Meal: skip Snack: cheese and deli meat or crackers, cheese and deli meat Third Meal: meat and 1-2 vegetables Snack: protein bar Beverages: water, Gatorade zero or regular Gatorade  Alcoholic beverages per week: 0   Estimated Energy Needs Calories: 1500  NUTRITION DIAGNOSIS  Overweight/obesity ([place]-3.3) related to past poor dietary habits and physical inactivity as evidenced by patient w/ planned revision from sleeve to RYGB surgery following dietary guidelines for continued weight loss.  NUTRITION INTERVENTION  Nutrition counseling (C-1) and education (E-2) to facilitate bariatric surgery goals.  Educated pt on micronutrient deficiencies post-surgery and behavioral/dietary strategies to start in order to mitigate that risk   Behavioral and Dietary Interventions Pre-Op Goals Reviewed with the Patient Nutrition: Healthy Eating Behaviors Switch to non-caloric, non-carbonated and non-caffeinated beverages such as  water, unsweetened tea, Crystal Light and zero calorie beverages (aim for 64 oz. per  day) Cut out grazing between meals or at night  Find a protein shake you like Eat every 3-5 hours        Eliminate  distractions while eating (TV, computer, reading, driving, texting) Take 47-42 minutes to eat a meal  Decrease high sugar foods/decrease high fat/fried foods Eliminate alcoholic beverages Increase protein intake (eggs, fish, chicken, yogurt) before surgery; track protein, aim for 60 grams per day Eat non starchy vegetables 2 times a day 7 days a week Eat complex carbohydrates such as whole grains and fruits   Behavioral Modification: Physical Activity Increase my usual daily activity (use stairs, park farther, etc.) Engage in _______________________  activity  _______ minutes ______ times per week  Other:    _________________________________________________________________     Problem Solving I will think about my usual eating patterns and how to tweak them How can my friends and family support me Barriers to starting my changes Learn and understand appetite verses hunger   Healthy Coping Allow for ___________ activities per week to help me manage stress Reframe negative thoughts I will keep a picture of someone or something that is my inspiration & look at it daily   Monitoring  Weigh myself once a week  Measure my progress by monitoring how my clothes fit Keep a food record of what I eat and drink for the next ________ (time period) Take pictures of what I eat and drink for the next ________ (time period) Use an app to count steps/day for the next_______ (time period) Measure my progress such as increased energy and more restful sleep Monitor your acid reflux and bowel habits, are they getting better?   *Goals that are bolded indicate the pt would like to start working towards these  Handouts Provided Include  Bariatric Surgery handouts (Nutrition Visits, Pre Surgery Behavioral Change Goals, Protein Shakes Brands to Choose From, Vitamins & Mineral Supplementation)  Learning Style & Readiness for Change Teaching method utilized: Visual, Auditory, and hands on  Demonstrated  degree of understanding via: Teach Back  Readiness Level: preparation Barriers to learning/adherence to lifestyle change: mobility; GERD  RD's Notes for Next Visit    MONITORING & EVALUATION Dietary intake, weekly physical activity, body weight, and preoperative behavioral change goals   Next Steps  Pt has completed visits. No further supervised visits required/recommended. Patient is to follow up at [facility] for post-op class >2 weeks prior to scheduled surgery.

## 2023-06-26 ENCOUNTER — Other Ambulatory Visit (INDEPENDENT_AMBULATORY_CARE_PROVIDER_SITE_OTHER): Payer: Self-pay | Admitting: Internal Medicine

## 2023-06-26 DIAGNOSIS — R7303 Prediabetes: Secondary | ICD-10-CM

## 2023-07-15 ENCOUNTER — Encounter: Payer: 59 | Attending: Surgery | Admitting: Dietician

## 2023-07-15 ENCOUNTER — Encounter: Payer: Self-pay | Admitting: Dietician

## 2023-07-15 VITALS — Ht 65.0 in | Wt 310.8 lb

## 2023-07-15 DIAGNOSIS — E669 Obesity, unspecified: Secondary | ICD-10-CM | POA: Insufficient documentation

## 2023-07-15 NOTE — Progress Notes (Signed)
Pre-Operative Nutrition Class:    Patient was seen on 07/15/2023 for Pre-Operative Bariatric Surgery Education [facility]    Surgery date: 08/12/2023 Surgery type: Conversion from Sleeve to RYGB  Anthropometrics  Start weight at [facility]: 315.4 lbs (date: 06/21/2023)  Height: 65 in Weight today: 310.8 lbs BMI: 52.49 kg/m2     Clinical   Medical hx: HTN, GERD, asthma, obesity Medications: metformin, losarten, gabapentin,   Labs: A1c 5.7;  Notable signs/symptoms: none noted Any previous deficiencies? No  Samples given per MNT protocol. Patient educated on appropriate usage: [employer] Multivitamin Lot # [id number] Exp: 12/2024   [employer] Vitamins Calcium  Lot # [id number] Exp: 01/2024   Ensure Max Protein Shake Lot # [id number] Exp: 04/29/2024  The following the learning objectives were met by the patient during this course: Identify Pre-Op Dietary Goals and will begin 2 weeks pre-operatively Identify appropriate sources of fluids and proteins  State protein recommendations and appropriate sources pre and post-operatively Identify Post-Operative Dietary Goals and will follow for 2 weeks post-operatively Identify appropriate multivitamin and calcium sources Describe the need for physical activity post-operatively and will follow MD recommendations State when to call healthcare provider regarding medication questions or post-operative complications When having a diagnosis of diabetes understanding hypoglycemia symptoms and the inclusion of 1 complex carbohydrate per meal  Handouts given during class include: Pre-Op Bariatric Surgery Diet Handout Protein Shake Handout Post-Op Bariatric Surgery Nutrition Handout BELT Program Information Flyer Support Group Information Flyer [facility] Bariatric Supplements Price List  Follow-Up Plan: Patient will follow-up at [facility] 2 weeks post operatively for diet advancement per MD.

## 2023-09-13 ENCOUNTER — Encounter (HOSPITAL_COMMUNITY): Payer: Self-pay

## 2023-09-13 ENCOUNTER — Ambulatory Visit (HOSPITAL_COMMUNITY)
Admission: EM | Admit: 2023-09-13 | Discharge: 2023-09-13 | Disposition: A | Discharge status: Home / Self Care | Payer: 59 | Attending: Family Medicine | Admitting: Family Medicine

## 2023-09-13 DIAGNOSIS — J069 Acute upper respiratory infection, unspecified: Secondary | ICD-10-CM | POA: Diagnosis not present

## 2023-09-13 DIAGNOSIS — J4521 Mild intermittent asthma with (acute) exacerbation: Secondary | ICD-10-CM | POA: Diagnosis not present

## 2023-09-13 MED ORDER — DOXYCYCLINE HYCLATE 100 MG PO CAPS: 100.0000 mg | ORAL_CAPSULE | Freq: Two times a day (BID) | ORAL | 0 refills | Status: AC

## 2023-09-13 MED ORDER — PREDNISONE 20 MG PO TABS: 40.0000 mg | ORAL_TABLET | Freq: Every day | ORAL | 0 refills | Status: AC

## 2023-09-13 MED ORDER — BENZONATATE 100 MG PO CAPS: 100.0000 mg | ORAL_CAPSULE | Freq: Three times a day (TID) | ORAL | 0 refills | Status: DC | PRN

## 2023-09-13 NOTE — ED Provider Notes (Signed)
[facility]    CSN: [id number] Arrival date & time: 09/13/23  1800      History   Chief Complaint Chief Complaint  Patient presents with   Cough    HPI Maureen Flores is a 53 y.o. female.    Cough Here for cough and wheezing and congestion.  She first got sick with upper respiratory symptoms on February 7.  She may have had some fever at the very first but that has resolved.  She is feeling better but she still has a lot of cough and is now wheezing and having congestion in her chest.  She does have a history of asthma.  No nausea or vomiting or diarrhea.  NKDA  Last menstrual cycle was sometime ago.  Her albuterol inhaler does help some when she uses it.  She states she has been taken off diabetes medications after her bariatric surgery.  Past Medical History:  Diagnosis Date   Anemia    Arthritis    Asthma    Back pain    Diabetes mellitus without complication (HCC)    type 2   Female infertility    Food allergy    Headache    Hypertension    Joint pain    Migraines    Obesity    Osteoarthritis    Prediabetes    SOB (shortness of breath)     Patient Active Problem List   Diagnosis Date Noted   Heartburn 05/30/2023   Nausea and vomiting 05/30/2023   Gastroesophageal reflux disease 05/30/2023   Eating disorder 01/29/2023   Hiatal hernia with GERD 01/08/2023   H/O gastric sleeve 09/13/2022   Prediabetes 09/13/2022   Primary hypertension 09/13/2022   Pure hypercholesterolemia 09/13/2022   Bilateral primary osteoarthritis of knee 01/07/2022   Trochanteric bursitis, right hip 01/07/2022   Class 3 severe obesity with serious comorbidity and body mass index (BMI) of 50.0 to 59.9 in adult (HCC) 04/17/2021    Past Surgical History:  Procedure Laterality Date   65 HOUR PH STUDY N/A 05/15/2023   Procedure: 24 HOUR PH STUDY;  Surgeon: Napoleon Form, MD;  Location: [facility];  Service: Gastroenterology;  Laterality: N/A;   CESAREAN  SECTION     ESOPHAGEAL MANOMETRY N/A 05/15/2023   Procedure: ESOPHAGEAL MANOMETRY (EM);  Surgeon: Napoleon Form, MD;  Location: [facility];  Service: Gastroenterology;  Laterality: N/A;   LAPAROSCOPIC GASTRIC SLEEVE RESECTION N/A 04/17/2021   Procedure: LAPAROSCOPIC GASTRIC SLEEVE RESECTION;  Surgeon: Berna Bue, MD;  Location: [facility];  Service: General;  Laterality: N/A;   UPPER GI ENDOSCOPY N/A 04/17/2021   Procedure: UPPER GI ENDOSCOPY;  Surgeon: Berna Bue, MD;  Location: [facility];  Service: General;  Laterality: N/A;   WISDOM TOOTH EXTRACTION      OB History     Gravida  1   Para  1   Term      Preterm      AB      Living         SAB      IAB      Ectopic      Multiple      Live Births               Home Medications    Prior to Admission medications   Medication Sig Start Date End Date Taking? Authorizing Provider  albuterol (VENTOLIN HFA) 108 (90 Base) MCG/ACT inhaler Inhale 2 puffs into the lungs every 6 (  six) hours as needed for shortness of breath or wheezing. 07/16/14  Yes [provider]  amLODipine (NORVASC) 10 MG tablet Take 10 mg by mouth daily. 12/01/20  Yes [provider]  benzonatate (TESSALON) 100 MG capsule Take 1 capsule (100 mg total) by mouth 3 (three) times daily as needed for cough. 09/13/23  Yes Zenia Resides, MD  Calcium Citrate-Vitamin D (CALCIUM CITRATE + D PO) Take 500 mg by mouth daily at 12 noon.   Yes [provider]  carvedilol (COREG) 25 MG tablet Take 25 mg by mouth 2 (two) times daily with a meal.   Yes [provider]  doxycycline (VIBRAMYCIN) 100 MG capsule Take 1 capsule (100 mg total) by mouth 2 (two) times daily for 7 days. 09/13/23 09/20/23 Yes Zenia Resides, MD  DULoxetine (CYMBALTA) 30 MG capsule Take 30 mg by mouth in the morning, at noon, and at bedtime.   Yes [provider]  ferrous sulfate 325 (65 FE) MG EC tablet Take 325 mg by mouth 3 (three)  times daily with meals.   Yes [provider]  fluticasone (FLONASE) 50 MCG/ACT nasal spray Place 2 sprays into both nostrils daily as needed for allergies or rhinitis.   Yes [provider]  gabapentin (NEURONTIN) 800 MG tablet Take 800 mg by mouth 3 (three) times daily.   Yes [provider]  losartan (COZAAR) 100 MG tablet Take 0.5 tablets (50 mg total) by mouth daily. 04/18/21  Yes Berna Bue, MD  montelukast (SINGULAIR) 10 MG tablet Take 10 mg by mouth at bedtime.   Yes [provider]  Multiple Vitamins-Minerals (BARIATRIC MULTIVITAMINS/IRON PO) Take by mouth daily at 12 noon. With 45 mg iron   Yes [provider]  pantoprazole (PROTONIX) 40 MG tablet Take 50 mg by mouth daily.   Yes [provider]  predniSONE (DELTASONE) 20 MG tablet Take 2 tablets (40 mg total) by mouth daily with breakfast for 5 days. 09/13/23 09/18/23 Yes Zenia Resides, MD  simvastatin (ZOCOR) 10 MG tablet Take 10 mg by mouth daily.   Yes [provider]  sucralfate (CARAFATE) 1 g tablet Take 1 g by mouth 4 (four) times daily. 01/04/23  Yes [provider]  topiramate (TOPAMAX) 50 MG tablet Take 1 tablet (50 mg total) by mouth at bedtime. Take 1/2 tablet at bedtime for one week, then increase to 1 tablet at bedtime Patient taking differently: Take 25 mg by mouth 2 (two) times daily. 02/06/21  Yes Jaffe, Adam R, DO  triamcinolone (KENALOG) 0.025 % cream Apply 1 application topically daily.   Yes [provider]  triamcinolone lotion (KENALOG) 0.1 % Apply 1 application  topically once a week. 02/05/21  Yes [provider]  triamterene-hydrochlorothiazide (MAXZIDE-25) 37.5-25 MG tablet Take 1 tablet by mouth daily.   Yes [provider]  traMADol (ULTRAM) 50 MG tablet Take 1 tablet (50 mg total) by mouth every 12 (twelve) hours as needed (pain). 01/05/22   Magnant, Joycie Peek, PA-C    Family History Family History  Problem  Relation Age of Onset   Stroke Mother    Thyroid disease Mother    Sleep apnea Mother    Alcoholism Mother    Drug abuse Mother     Social History Social History   Tobacco Use   Smoking status: Never   Smokeless tobacco: Never  Vaping Use   Vaping status: Never Used  Substance Use Topics   Alcohol use: Yes  Comment: social wine   Drug use: No     Allergies   Coconut (cocos nucifera) and Other   Review of Systems Review of Systems  Respiratory:  Positive for cough.      Physical Exam Triage Vital Signs ED Triage Vitals  Encounter Vitals Group     BP 09/13/23 1846 (!) 162/96     Systolic BP Percentile --      Diastolic BP Percentile --      Pulse Rate 09/13/23 1846 63     Resp 09/13/23 1846 18     Temp 09/13/23 1846 98.2 F (36.8 C)     Temp Source 09/13/23 1846 Oral     SpO2 09/13/23 1846 98 %     Weight --      Height --      Head Circumference --      Peak Flow --      Pain Score 09/13/23 1840 0     Pain Loc --      Pain Education --      Exclude from Growth Chart --    No data found.  Updated Vital Signs BP (!) 162/96 (BP Location: Left Wrist)   Pulse 63   Temp 98.2 F (36.8 C) (Oral)   Resp 18   LMP  (LMP Unknown)   SpO2 98%   Visual Acuity Right Eye Distance:   Left Eye Distance:   Bilateral Distance:    Right Eye Near:   Left Eye Near:    Bilateral Near:     Physical Exam Vitals reviewed.  Constitutional:      General: She is not in acute distress.    Appearance: She is not ill-appearing, toxic-appearing or diaphoretic.  HENT:     Right Ear: Tympanic membrane and ear canal normal.     Left Ear: Tympanic membrane and ear canal normal.     Nose: Congestion present.     Mouth/Throat:     Mouth: Mucous membranes are moist.     Pharynx: No oropharyngeal exudate or posterior oropharyngeal erythema.  Eyes:     Extraocular Movements: Extraocular movements intact.     Conjunctiva/sclera: Conjunctivae normal.     Pupils: Pupils  are equal, round, and reactive to light.  Cardiovascular:     Rate and Rhythm: Normal rate and regular rhythm.     Heart sounds: No murmur heard. Pulmonary:     Effort: No respiratory distress.     Breath sounds: No stridor. No rhonchi or rales.     Comments: There are expiratory wheezes heard in all lung fields, with fairly good air movement still. Chest:     Chest wall: No tenderness.  Musculoskeletal:     Cervical back: Neck supple.  Lymphadenopathy:     Cervical: No cervical adenopathy.  Skin:    Capillary Refill: Capillary refill takes less than 2 seconds.     Coloration: Skin is not jaundiced or pale.  Neurological:     General: No focal deficit present.     Mental Status: She is alert and oriented to person, place, and time.  Psychiatric:        Behavior: Behavior normal.      UC Treatments / Results  Labs (all labs ordered are listed, but only abnormal results are displayed) Labs Reviewed - No data to display  EKG   Radiology No results found.  Procedures Procedures (including critical care time)  Medications Ordered in UC Medications - No data to display  Initial Impression / Assessment and Plan / UC Course  I have reviewed the triage vital signs and the nursing notes.  Pertinent labs & imaging results that were available during my care of the patient were reviewed by me and considered in my medical decision making (see chart for details).     She had not done any home testing and she is outside the window where testing for flu or COVID would be helpful.  Prednisone is sent in to treat her asthma exacerbation.  Doxycycline sent in to treat acute bronchitis.  She already has an inhaler at home Final Clinical Impressions(s) / UC Diagnoses   Final diagnoses:  Viral URI with cough  Mild intermittent asthma with exacerbation     Discharge Instructions      Take prednisone 20 mg--2 daily for 5 days  Take doxycycline 100 mg --1 capsule 2 times daily  for 7 days  Take benzonatate 100 mg, 1 tab every 8 hours as needed for cough.  Continue to use your albuterol inhaler as needed.       ED Prescriptions     Medication Sig Dispense Auth. Provider   benzonatate (TESSALON) 100 MG capsule Take 1 capsule (100 mg total) by mouth 3 (three) times daily as needed for cough. 21 capsule Zenia Resides, MD   predniSONE (DELTASONE) 20 MG tablet Take 2 tablets (40 mg total) by mouth daily with breakfast for 5 days. 10 tablet Zenia Resides, MD   doxycycline (VIBRAMYCIN) 100 MG capsule Take 1 capsule (100 mg total) by mouth 2 (two) times daily for 7 days. 14 capsule Marlinda Mike, Janace Aris, MD      PDMP not reviewed this encounter.   Zenia Resides, MD 09/13/23 [id number]

## 2023-09-13 NOTE — ED Triage Notes (Signed)
Chief Complaint: cough, loss of taste/smell, chest congestion, runny nose, and headache. Has history of asthma.   Sick exposure: Yes- Patient's husband was sick first and also being seen  Onset: 7 days  Prescriptions or OTC medications tried: Yes- Mucinex, Theraflu    with little relief  New foods, medications, or products: No  Recent Travel: No

## 2023-09-13 NOTE — Discharge Instructions (Signed)
Take prednisone 20 mg--2 daily for 5 days  Take doxycycline 100 mg --1 capsule 2 times daily for 7 days  Take benzonatate 100 mg, 1 tab every 8 hours as needed for cough.  Continue to use your albuterol inhaler as needed.

## 2023-10-28 DIAGNOSIS — J4520 Mild intermittent asthma, uncomplicated: Secondary | ICD-10-CM | POA: Insufficient documentation

## 2023-11-21 ENCOUNTER — Encounter: Payer: Self-pay | Admitting: Physician Assistant

## 2023-11-21 ENCOUNTER — Other Ambulatory Visit (INDEPENDENT_AMBULATORY_CARE_PROVIDER_SITE_OTHER): Payer: Self-pay

## 2023-11-21 ENCOUNTER — Ambulatory Visit: Admitting: Physician Assistant

## 2023-11-21 DIAGNOSIS — M170 Bilateral primary osteoarthritis of knee: Secondary | ICD-10-CM

## 2023-11-21 MED ORDER — METHYLPREDNISOLONE ACETATE 40 MG/ML IJ SUSP
40.0000 mg | INTRAMUSCULAR | Status: AC | PRN
  Administered 2023-11-21: 40 mg via INTRA_ARTICULAR

## 2023-11-21 MED ORDER — DICLOFENAC SODIUM 1 % EX GEL: 4.0000 g | Freq: Four times a day (QID) | CUTANEOUS | 2 refills | Status: DC

## 2023-11-21 MED ORDER — LIDOCAINE HCL 1 % IJ SOLN
3.0000 mL | INTRAMUSCULAR | Status: AC | PRN
  Administered 2023-11-21: 3 mL

## 2023-11-21 MED ORDER — TRAMADOL HCL 50 MG PO TABS: 50.0000 mg | ORAL_TABLET | Freq: Four times a day (QID) | ORAL | 0 refills | Status: DC | PRN

## 2023-11-21 NOTE — Progress Notes (Signed)
 Office Visit Note   Patient: Maureen Flores           Date of Birth: 1970-08-20           MRN: [id number] Visit Date: 11/21/2023              Requested by: Amadeo June, MD [address] [place],  [place] [address] PCP: Amadeo June, MD   Assessment & Plan: Visit Diagnoses:  1. Bilateral primary osteoarthritis of knee     Plan: She understands to wait 3 months between cortisone injections.  Discussed with her pain medications we will refill her tramadol  she is to use this sparingly.  She will continue to work on quad strengthening weight loss.  Questions were encouraged and answered at length.  Follow-Up Instructions: Return if symptoms worsen or fail to improve.   Orders:  Orders Placed This Encounter  Procedures   Large Joint Inj: bilateral knee   XR Knee 1-2 Views Right   XR Knee 1-2 Views Left   Meds ordered this encounter  Medications   traMADol  (ULTRAM ) 50 MG tablet    Sig: Take 1 tablet (50 mg total) by mouth every 6 (six) hours as needed (pain).    Dispense:  30 tablet    Refill:  0   diclofenac  Sodium (VOLTAREN ) 1 % GEL    Sig: Apply 4 g topically 4 (four) times daily.    Dispense:  350 g    Refill:  2      Procedures: Large Joint Inj: bilateral knee on 11/21/2023 4:21 PM Indications: pain Details: 22 G 1.5 in needle, anterolateral approach  Arthrogram: No  Medications (Right): 3 mL lidocaine  1 %; 40 mg methylPREDNISolone  acetate 40 MG/ML Medications (Left): 3 mL lidocaine  1 %; 40 mg methylPREDNISolone  acetate 40 MG/ML Outcome: tolerated well, no immediate complications Procedure, treatment alternatives, risks and benefits explained, specific risks discussed. Consent was given by the patient. Immediately prior to procedure a time out was called to verify the correct patient, procedure, equipment, support staff and site/side marked as required. Patient was prepped and draped in the usual sterile fashion.       Clinical Data: No additional  findings.   Subjective: Chief Complaint  Patient presents with   Right Knee - Pain   Left Knee - Pain    HPI Mrs. Maureen Flores is 53 year old female who sees Dr. Murrel Arnt in the past.  Comes in today requesting bilateral knee injections.  Last injections were 11/20/2022 with cortisone.  Helped until about few months ago.  Patient has undergone gastric sleeve surgery and has lost approximately 130 pounds.  She continues to have knee pain right greater than left.  No known injuries.  Denies any fevers chills.  She is nondiabetic.  She has been taking Tylenol  and tramadol  for pain relief.  Review of Systems  Constitutional:  Negative for chills and fever.     Objective: Vital Signs: There were no vitals taken for this visit.  Physical Exam Constitutional:      Appearance: She is not ill-appearing or diaphoretic.  Pulmonary:     Effort: Pulmonary effort is normal.  Neurological:     Mental Status: She is alert and oriented to person, place, and time.  Psychiatric:        Mood and Affect: Mood normal.     Ortho Exam Bilateral knees: Good range of motion.  No abnormal warmth erythema.  No gross instability of either knee. Specialty Comments:  No specialty  comments available.  Imaging: XR Knee 1-2 Views Right Result Date: 11/21/2023 Right knee 2 views: No acute fracture.  Varus deformity with bone-on-bone medial compartment.  Lateral compartment mild to moderate arthritic changes..  Severe patellofemoral arthritic changes.  XR Knee 1-2 Views Left Result Date: 11/21/2023 Left knee: 2 views no acute fractures.  Near bone-on-bone medial compartment.  Varus deformity.  Severe patellofemoral arthritic changes.  Marginal osteophytes lateral compartment.    PMFS History: Patient Active Problem List   Diagnosis Date Noted   Heartburn 05/30/2023   Nausea and vomiting 05/30/2023   Gastroesophageal reflux disease 05/30/2023   Eating disorder 01/29/2023   Hiatal hernia with GERD 01/08/2023    H/O gastric sleeve 09/13/2022   Prediabetes 09/13/2022   Primary hypertension 09/13/2022   Pure hypercholesterolemia 09/13/2022   Bilateral primary osteoarthritis of knee 01/07/2022   Trochanteric bursitis, right hip 01/07/2022   Class 3 severe obesity with serious comorbidity and body mass index (BMI) of 50.0 to 59.9 in adult [facility]) 04/17/2021   Past Medical History:  Diagnosis Date   Anemia    Arthritis    Asthma    Back pain    Diabetes mellitus without complication (HCC)    type 2   Female infertility    Food allergy    Headache    Hypertension    Joint pain    Migraines    Obesity    Osteoarthritis    Prediabetes    SOB (shortness of breath)     Family History  Problem Relation Age of Onset   Stroke Mother    Thyroid disease Mother    Sleep apnea Mother    Alcoholism Mother    Drug abuse Mother     Past Surgical History:  Procedure Laterality Date   79 HOUR PH STUDY N/A 05/15/2023   Procedure: 24 HOUR PH STUDY;  Surgeon: Sergio Dandy, MD;  Location: [facility];  Service: Gastroenterology;  Laterality: N/A;   CESAREAN SECTION     ESOPHAGEAL MANOMETRY N/A 05/15/2023   Procedure: ESOPHAGEAL MANOMETRY (EM);  Surgeon: Sergio Dandy, MD;  Location: [facility];  Service: Gastroenterology;  Laterality: N/A;   LAPAROSCOPIC GASTRIC SLEEVE RESECTION N/A 04/17/2021   Procedure: LAPAROSCOPIC GASTRIC SLEEVE RESECTION;  Surgeon: Adalberto Acton, MD;  Location: [facility];  Service: General;  Laterality: N/A;   UPPER GI ENDOSCOPY N/A 04/17/2021   Procedure: UPPER GI ENDOSCOPY;  Surgeon: Adalberto Acton, MD;  Location: [facility];  Service: General;  Laterality: N/A;   WISDOM TOOTH EXTRACTION     Social History   Occupational History   Not on file  Tobacco Use   Smoking status: Never   Smokeless tobacco: Never  Vaping Use   Vaping status: Never Used  Substance and Sexual Activity   Alcohol use: Yes    Comment: social wine   Drug use: No   Sexual activity:  Not on file

## 2024-01-05 ENCOUNTER — Other Ambulatory Visit: Payer: Self-pay | Admitting: Physician Assistant

## 2024-01-06 ENCOUNTER — Other Ambulatory Visit: Payer: Self-pay | Admitting: Physician Assistant

## 2024-01-06 MED ORDER — TRAMADOL HCL 50 MG PO TABS: 50.0000 mg | ORAL_TABLET | Freq: Four times a day (QID) | ORAL | 0 refills | Status: DC | PRN

## 2024-02-20 ENCOUNTER — Ambulatory Visit: Admitting: Physician Assistant

## 2024-02-20 ENCOUNTER — Encounter: Payer: Self-pay | Admitting: Physician Assistant

## 2024-02-20 DIAGNOSIS — M170 Bilateral primary osteoarthritis of knee: Secondary | ICD-10-CM

## 2024-02-20 MED ORDER — LIDOCAINE HCL 1 % IJ SOLN
3.0000 mL | INTRAMUSCULAR | Status: AC | PRN
  Administered 2024-02-20: 3 mL

## 2024-02-20 MED ORDER — METHYLPREDNISOLONE ACETATE 40 MG/ML IJ SUSP
40.0000 mg | INTRAMUSCULAR | Status: AC | PRN
  Administered 2024-02-20: 40 mg via INTRA_ARTICULAR

## 2024-02-20 NOTE — Progress Notes (Signed)
   Procedure Note  Patient: Maureen Flores             Date of Birth: Dec 13, 1970           MRN: [id number]             Visit Date: 02/20/2024  HPI: Maureen Flores returns today requesting cortisone injections of both knees.  We last saw her 11/21/2023 she is given injections of both knees and states these were very helpful no adverse effects.  She states the injections helped her until about 2 weeks ago.  Denies any fevers chills or injury to either knee.  Right knee is more bothersome than the left.  Physical exam: General Well-developed well-nourished female no acute distress mood and affect appropriate. Bilateral knees: Good range of motion of both knees no abnormal warmth erythema.  Significant patellofemoral crepitus right knee.  Procedures: Visit Diagnoses: No diagnosis found.  Large Joint Inj: bilateral knee on 02/20/2024 5:04 PM Indications: pain Details: 22 G 1.5 in needle, anterolateral approach  Arthrogram: No  Medications (Right): 3 mL lidocaine  1 %; 40 mg methylPREDNISolone  acetate 40 MG/ML Medications (Left): 3 mL lidocaine  1 %; 40 mg methylPREDNISolone  acetate 40 MG/ML Outcome: tolerated well, no immediate complications Procedure, treatment alternatives, risks and benefits explained, specific risks discussed. Consent was given by the patient. Immediately prior to procedure a time out was called to verify the correct patient, procedure, equipment, support staff and site/side marked as required. Patient was prepped and draped in the usual sterile fashion.      Plan: She knows to wait at least 3 months between cortisone injections.  Questions were encouraged and answered

## 2024-02-23 ENCOUNTER — Other Ambulatory Visit: Payer: Self-pay | Admitting: Physician Assistant

## 2024-02-24 ENCOUNTER — Encounter: Attending: Surgery | Admitting: Skilled Nursing Facility1

## 2024-02-24 VITALS — Wt 286.4 lb

## 2024-02-24 DIAGNOSIS — E66813 Obesity, class 3: Secondary | ICD-10-CM | POA: Insufficient documentation

## 2024-02-24 DIAGNOSIS — Z6841 Body Mass Index (BMI) 40.0 and over, adult: Secondary | ICD-10-CM | POA: Diagnosis present

## 2024-02-26 ENCOUNTER — Encounter: Payer: Self-pay | Admitting: Skilled Nursing Facility1

## 2024-02-26 NOTE — Progress Notes (Signed)
 Pre-Operative Nutrition Class:    Patient was seen on 02/24/2024 for Pre-Operative Bariatric Surgery Education at [facility].    Surgery date:  Surgery type: RYGB from sleeve Start weight at [facility]: 315.4 Weight today: 286.4    The following the learning objectives were met by the patient during this course: Identify Pre-Op Dietary Goals and will begin 2 weeks pre-operatively Identify appropriate sources of fluids and proteins  State protein recommendations and appropriate sources pre and post-operatively Identify Post-Operative Dietary Goals and will follow for 2 weeks post-operatively Identify appropriate multivitamin and calcium sources Describe the need for physical activity post-operatively and will follow MD recommendations State when to call healthcare provider regarding medication questions or post-operative complications When having a diagnosis of diabetes understanding hypoglycemia symptoms and the inclusion of 1 complex carbohydrate per meal  Handouts given during class include: Pre-Op Bariatric Surgery Diet Handout Protein Shake Handout Post-Op Bariatric Surgery Nutrition Handout BELT Program Information Flyer Support Group Information Flyer [facility] Bariatric Supplements Price List  Follow-Up Plan: Patient will follow-up at [facility] 2 weeks post operatively for diet advancement per MD.

## 2024-02-28 ENCOUNTER — Encounter: Payer: Self-pay | Admitting: Family Medicine

## 2024-02-28 ENCOUNTER — Ambulatory Visit: Admitting: Family Medicine

## 2024-02-28 VITALS — BP 138/88 | HR 51 | Temp 98.2°F | Ht 65.0 in | Wt 285.0 lb

## 2024-02-28 DIAGNOSIS — E785 Hyperlipidemia, unspecified: Secondary | ICD-10-CM

## 2024-02-28 DIAGNOSIS — Z1231 Encounter for screening mammogram for malignant neoplasm of breast: Secondary | ICD-10-CM

## 2024-02-28 DIAGNOSIS — Z1211 Encounter for screening for malignant neoplasm of colon: Secondary | ICD-10-CM

## 2024-02-28 DIAGNOSIS — M1711 Unilateral primary osteoarthritis, right knee: Secondary | ICD-10-CM

## 2024-02-28 DIAGNOSIS — J4520 Mild intermittent asthma, uncomplicated: Secondary | ICD-10-CM

## 2024-02-28 DIAGNOSIS — Z6841 Body Mass Index (BMI) 40.0 and over, adult: Secondary | ICD-10-CM

## 2024-02-28 DIAGNOSIS — I10 Essential (primary) hypertension: Secondary | ICD-10-CM | POA: Diagnosis not present

## 2024-02-28 DIAGNOSIS — G43009 Migraine without aura, not intractable, without status migrainosus: Secondary | ICD-10-CM

## 2024-02-28 DIAGNOSIS — K219 Gastro-esophageal reflux disease without esophagitis: Secondary | ICD-10-CM

## 2024-02-28 DIAGNOSIS — Z903 Acquired absence of stomach [part of]: Secondary | ICD-10-CM

## 2024-02-28 DIAGNOSIS — Z7689 Persons encountering health services in other specified circumstances: Secondary | ICD-10-CM

## 2024-02-28 DIAGNOSIS — M7989 Other specified soft tissue disorders: Secondary | ICD-10-CM

## 2024-02-28 NOTE — Progress Notes (Signed)
 Patient Office Visit  Assessment & Plan:  Primary hypertension -     CBC with Differential/Platelet -     Comprehensive metabolic panel with GFR  Hyperlipidemia, unspecified hyperlipidemia type -     Lipid panel  Screening for colorectal cancer -     Ambulatory referral to Gastroenterology  Gastroesophageal reflux disease without esophagitis -     Ambulatory referral to Gastroenterology  Encounter to establish care  BMI 45.0-49.9, adult (HCC) -     Hemoglobin A1c  Swelling of lower leg  Osteoarthritis of right knee, unspecified osteoarthritis type  Nonintractable common migraine  Mild intermittent asthma without complication  H/O gastric sleeve  Screening mammogram for breast cancer -     3D Screening Mammogram, Left and Right; Future   Assessment and Plan    Morbid obesity, status post bariatric surgery with ongoing weight loss and planned surgical revision for refractory gastroesophageal reflux disease Significant weight loss achieved. Persistent gastroesophageal reflux disease symptoms despite medication. Surgical revision recommended. - Proceed with surgical revision on March 17, 2024, pending insurance approval. - Continue famotidine, pantoprazole , and sucralfate.  Bilateral knee osteoarthritis, right worse than left Orthopedic recommendation for knee replacement contingent on further weight loss to BMI of 40. - Continue weight loss efforts to reach BMI of 40 for knee replacement eligibility.  Hypertension, controlled Hypertension well-controlled, likely improved due to weight loss.  Hyperlipidemia, stable on therapy Hyperlipidemia stable on current therapy.  Chronic right lower extremity edema, likely medication-related Edema improved with weight loss. Amlodipine  may contribute to swelling.  Migraine, improved on current therapy Migraine frequency reduced with Topamax . - Continue Topamax  for migraine management.  Asthma, mild intermittent,  stable Asthma well-controlled. Expired albuterol  inhaler noted. - Dispose of expired albuterol  inhaler.  Left middle ear effusion Effusion with fluid present, no infection. Symptoms include fullness. - Use Flonase nasal spray to alleviate symptoms.  General Health Maintenance Mammogram due. Previous Pap smear normal. Incomplete colonoscopy due to respiratory issues. - Order mammogram at the breast center. - Arrange for colonoscopy referral in [place].     Test results were reviewed and analyzed as part of the medical decision making of this visit.  Reviewed previous notes from [facility] and previous lab work during the office visit.  Congratulated her on ongoing weight loss. Recommend healthy diet i.e mediterranean/DASH diet, consistent exercise - 30 minutes 5 day per week, and gradual weight loss.  Return in about 3 months (around 05/30/2024), or if symptoms worsen or fail to improve.   Subjective:    Patient ID: Maureen Flores, female    DOB: 03-23-1971  Age: 53 y.o. MRN: [id number]  Chief Complaint  Patient presents with   Medical Management of Chronic Issues   Establish Care    HPI Discussed the use of AI scribe software for clinical note transcription with the patient, who gave verbal consent to proceed.  History of Present Illness        Maureen Flores is a 53 year old female with gastroesophageal reflux disease who presents for follow-up regarding her acid reflux, weight management and follow up on chronic medical issues, establish care here.   She has experienced significant weight loss, reducing her weight from 437 pounds to 285 pounds. 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 She reports being able to walk through airports and participate in a cruise without assistance, and describes improved mobility.  She experiences  severe acid reflux,  leading to vomiting in her sleep. She is currently taking famotidine, pantoprazole  twice daily, and occasionally sucralfate, although she finds last medication ineffective. Despite medication, symptoms persist even without food intake. She is awaiting insurance approval for a surgical revision scheduled for August 19th per [facility] in [place]  She continues to take amlodipine  for blood pressure and cholesterol medication without issues. She was previously on a diuretic for leg swelling, but it was discontinued due to dehydration. She reports that her leg swelling has improved, although the right leg remains more swollen than the left.  She experiences migraines less frequently, now having about five to six per month, and continues to take Topamax . She reports no recent need for albuterol  for asthma.  She mentions a recent attempt at a colonoscopy which was unsuccessful due to respiratory issues during the procedure. She is awaiting a new referral for this. Socially, she is active with her [employer] work and recently enjoyed a cruise with her daughter, highlighting improved mobility and quality of life. patient is able to walk and move much better since the weight loss. patient is pleased with her weight loss journey  Physical Exam MEASUREMENTS: Weight- 285, BMI- 47.0. HEENT: Left ear with fluid, not red or bulging. EXTREMITIES: Legs improved, less swelling. Results Assessment & Plan Morbid obesity, status post bariatric surgery with ongoing weight loss and planned surgical revision for refractory gastroesophageal reflux disease Significant weight loss achieved. Persistent gastroesophageal reflux disease symptoms despite medication. Surgical revision recommended. - Proceed with surgical revision on March 17, 2024, pending insurance approval. - Continue famotidine, pantoprazole , and sucralfate.  Bilateral knee osteoarthritis, right worse than left Orthopedic recommendation for knee replacement  contingent on further weight loss to BMI of 40. - Continue weight loss efforts to reach BMI of 40 for knee replacement eligibility.  Hypertension, controlled Hypertension well-controlled, likely improved due to weight loss.  Hyperlipidemia, stable on therapy Hyperlipidemia stable on current therapy.  Chronic right lower extremity edema, likely medication-related Edema improved with weight loss. Amlodipine  may contribute to swelling.  Migraine, improved on current therapy Migraine frequency reduced with Topamax . - Continue Topamax  for migraine management.  Asthma, mild intermittent, stable Asthma well-controlled. Expired albuterol  inhaler noted. - Dispose of expired albuterol  inhaler.  Left middle ear effusion Effusion with fluid present, no infection. Symptoms include fullness. - Use Flonase nasal spray to alleviate symptoms.  General Health Maintenance Mammogram due. Previous Pap smear normal. Incomplete colonoscopy due to respiratory issues. - Order mammogram at the breast center. - Arrange for colonoscopy referral in [place].    The ASCVD Risk score (Arnett DK, et al., 2019) failed to calculate for the following reasons:   The valid total cholesterol range is 130 to 320 mg/dL  Past Medical History:  Diagnosis Date   Allergy    Coconut   Anemia    Arthritis    Asthma    Back pain    Diabetes mellitus without complication (HCC)    type 2   Female infertility    Food allergy    GERD (gastroesophageal reflux disease) 2023   Headache    Hypertension    Joint pain    Migraines    Obesity    Osteoarthritis    Prediabetes    SOB (shortness of breath)    Past Surgical History:  Procedure Laterality Date   76 HOUR PH STUDY N/A 05/15/2023   Procedure: 24 HOUR PH STUDY;  Surgeon: Shila Gustav GAILS, MD;  Location: [facility];  Service: Gastroenterology;  Laterality: N/A;   CESAREAN SECTION     ESOPHAGEAL MANOMETRY N/A 05/15/2023   Procedure: ESOPHAGEAL  MANOMETRY (EM);  Surgeon: Shila Gustav GAILS, MD;  Location: [facility];  Service: Gastroenterology;  Laterality: N/A;   LAPAROSCOPIC GASTRIC SLEEVE RESECTION N/A 04/17/2021   Procedure: LAPAROSCOPIC GASTRIC SLEEVE RESECTION;  Surgeon: [name], MD;  Location: [facility];  Service: General;  Laterality: N/A;   UPPER GI ENDOSCOPY N/A 04/17/2021   Procedure: UPPER GI ENDOSCOPY;  Surgeon: [name], MD;  Location: [facility];  Service: General;  Laterality: N/A;   WISDOM TOOTH EXTRACTION     Social History   Tobacco Use   Smoking status: Never   Smokeless tobacco: Never   Tobacco comments:    Never  Vaping Use   Vaping status: Never Used  Substance Use Topics   Alcohol use: Not Currently    Comment: social wine   Drug use: No   Family History  Problem Relation Age of Onset   Stroke Mother    Thyroid disease Mother    Sleep apnea Mother    Alcoholism Mother    Drug abuse Mother    Asthma Mother    COPD Mother    Allergies  Allergen Reactions   Coconut (Cocos Nucifera) Rash    hives   Other     Cocunut    ROS    Objective:    BP 138/88   Pulse (!) 51   Temp 98.2 F (36.8 C)   Ht 5' 5 (1.651 m)   Wt 285 lb (129.3 kg)   LMP  (LMP Unknown)   SpO2 98%   BMI 47.43 kg/m  BP Readings from Last 3 Encounters:  02/28/24 138/88  09/13/23 (!) 162/96  03/19/23 134/82   Wt Readings from Last 3 Encounters:  02/28/24 285 lb (129.3 kg)  02/26/24 286 lb 6.4 oz (129.9 kg)  07/15/23 (!) 310 lb 12.8 oz (141 kg)    Physical Exam Vitals and nursing note reviewed.  Constitutional:      General: She is not in acute distress.    Appearance: Normal appearance.  HENT:     Head: Normocephalic.     Right Ear: Tympanic membrane, ear canal and external ear normal.     Left Ear: Tympanic membrane, ear canal and external ear normal.  Eyes:     Extraocular Movements: Extraocular movements intact.     Pupils: Pupils are equal, round, and reactive to light.   Cardiovascular:     Rate and Rhythm: Normal rate and regular rhythm.     Heart sounds: Normal heart sounds.  Pulmonary:     Effort: Pulmonary effort is normal.     Breath sounds: Normal breath sounds.  Abdominal:     Tenderness: There is no abdominal tenderness.  Musculoskeletal:     Right lower leg: Edema present.     Left lower leg: Edema present.     Comments: Lower legs- bilateral lower extremity edema, right leg is worse but much improved compared to previous office visits. No skin breakdown noted.   Neurological:     General: No focal deficit present.     Mental Status: She is alert and oriented to person, place, and time.  Psychiatric:        Mood and Affect: Mood normal.        Behavior: Behavior normal.        Thought Content: Thought content normal.  Judgment: Judgment normal.      No results found for any visits on 02/28/24.

## 2024-02-29 LAB — CBC WITH DIFFERENTIAL/PLATELET
Absolute Lymphocytes: 2847 {cells}/uL (ref 850–3900)
Absolute Monocytes: 556 {cells}/uL (ref 200–950)
Basophils Absolute: 66 {cells}/uL (ref 0–200)
Basophils Relative: 0.8 %
Eosinophils Absolute: 208 {cells}/uL (ref 15–500)
Eosinophils Relative: 2.5 %
HCT: 40.2 % (ref 35.0–45.0)
Hemoglobin: 12.9 g/dL (ref 11.7–15.5)
MCH: 29.6 pg (ref 27.0–33.0)
MCHC: 32.1 g/dL (ref 32.0–36.0)
MCV: 92.2 fL (ref 80.0–100.0)
MPV: 10.9 fL (ref 7.5–12.5)
Monocytes Relative: 6.7 %
Neutro Abs: 4623 {cells}/uL (ref 1500–7800)
Neutrophils Relative %: 55.7 %
Platelets: 292 Thousand/uL (ref 140–400)
RBC: 4.36 Million/uL (ref 3.80–5.10)
RDW: 13.1 % (ref 11.0–15.0)
Total Lymphocyte: 34.3 %
WBC: 8.3 Thousand/uL (ref 3.8–10.8)

## 2024-02-29 LAB — LIPID PANEL
Cholesterol: 124 mg/dL (ref <200)
HDL: 55 mg/dL (ref >=50)
LDL Cholesterol (Calc): 59 mg/dL
Non-HDL Cholesterol (Calc): 69 mg/dL (ref <130)
Total CHOL/HDL Ratio: 2.3 (calc) (ref <5.0)
Triglycerides: 35 mg/dL (ref <150)

## 2024-02-29 LAB — COMPREHENSIVE METABOLIC PANEL WITH GFR
AG Ratio: 1.4 (calc) (ref 1.0–2.5)
ALT: 13 U/L (ref 6–29)
AST: 15 U/L (ref 10–35)
Albumin: 4.3 g/dL (ref 3.6–5.1)
Alkaline phosphatase (APISO): 78 U/L (ref 37–153)
BUN/Creatinine Ratio: 28 (calc) — ABNORMAL HIGH (ref 6–22)
BUN: 33 mg/dL — ABNORMAL HIGH (ref 7–25)
CO2: 29 mmol/L (ref 20–32)
Calcium: 9.6 mg/dL (ref 8.6–10.4)
Chloride: 106 mmol/L (ref 98–110)
Creat: 1.18 mg/dL — ABNORMAL HIGH (ref 0.50–1.03)
Globulin: 3 g/dL (ref 1.9–3.7)
Glucose, Bld: 78 mg/dL (ref 65–99)
Potassium: 4 mmol/L (ref 3.5–5.3)
Sodium: 144 mmol/L (ref 135–146)
Total Bilirubin: 0.4 mg/dL (ref 0.2–1.2)
Total Protein: 7.3 g/dL (ref 6.1–8.1)
eGFR: 55 mL/min/1.73m2 — ABNORMAL LOW (ref >=60)

## 2024-02-29 LAB — HEMOGLOBIN A1C
Hgb A1c MFr Bld: 5.4 % (ref <5.7)
Mean Plasma Glucose: 108 mg/dL
eAG (mmol/L): 6 mmol/L

## 2024-03-02 ENCOUNTER — Ambulatory Visit: Payer: Self-pay | Admitting: Family Medicine

## 2024-03-05 NOTE — Progress Notes (Signed)
 Maureen Flores [id number]   Referring Provider:  Self   Subjective   Chief Complaint: Return Weight Loss (Pre-op)     History of Present Illness:  She continues to suffer daily with intractable reflux despite multimodal medication therapy including H2 blocker, PPI, Carafate, and as needed antacids.  Each of these modalities seems to help transiently but not consistently.  She is gone so far as to purchase a specialty bed which will keep her head and torso elevated when she is sleeping, but to no avail as her symptoms persist.  She remains compliant with antireflux eating measures. She did have an upper endoscopy done in May of this year that demonstrates an irregular Z-line without esophagitis or Barrett's, healthy previously and healthy appearing duodenum.  She did have a laryngospasm/apnea as the procedure was ending.  Describes burning sensation in her throat that is so severe she fears she is going to develop lesions or ulcerations in this area.  He did see her primary care doctor earlier this month and had labs done that showed a mild elevation in her creatinine; she does state that she still struggles to drink enough water  but continues to work on this.  She does report urinary frequency.  She is still not able to eat much and tries to focus on eating healthy foods.  She is walking for exercise.  She has lost about 23 pounds since her last visit here.    05/30/23: She has continued to suffer tremendously with heartburn.  This is despite changing her eating patterns, sleeping propped up on pillows, Carafate, PPI.  Often has nocturnal regurgitation, at least 2 times a week.  Symptoms are aggravated even by drinking water  and in fact this morning she has had nothing to eat and is quite uncomfortable.  Had an upper GI done in April 2024 that did show a tiny sliding hiatal hernia and mild reflux. pH probe with a DeMeester score of 26.4, overall report with evidence of significant  gastroesophageal acid reflux Manometry with normal relaxation of the GE junction hypercontractile esophagus likely related to her reflux   Last visit, May of this year: follows up today after recent issues with heartburn.  She has been regaining weight.  Saw healthy weight and wellness yesterday and was back up to 318 pounds which is up about 8 pounds since I last saw her in October.  She had started gabapentin  which may have been contributing.  She has been advised to track calories and has been given a goal of 1200 cal a day with 90 g of protein. She had communicated to us  in mid March that she felt food was getting stuck in her chest no matter how much she chewed or how slow she was eating; also reported that food is pushing back up into her esophagus with some burning at the top, noted some gagging at night with substance in her throat and esophagus which have been going on since the beginning of March.  We advised to go back to liquids only for a few days and ordered an upper GI which did show a tiny hiatal hernia and mild reflux with her lying prone, but nothing severe.  We recommended keeping her on Protonix  40 mg daily, antireflux eating style (eat slowly- bites the size of a thumbnail, chew until applesauce consistency, wait 20 seconds between bites; smaller more frequent meals, try to stand up and walk for 10 minutes after each meal); nothing to eat within 3  hours of bedtime); consider sleeping propped up on a few pillows.  Despite dietary changes, PPI and positioning changes she has continued to have daily reflux.  Has a burning sensation in her mid and upper esophageal region as well as occasional sensation of food sitting heavily in the epigastric region.  Also has been having worse pain in her hips and knees attributed to change in her gait from the weight loss she has had.  Last visit, October 2023: She is now a little over a year s/p sleeve. Overall she is doing well, but her weight loss  has stalled. Her knees are still a big issue and she is getting steroid injections periodically until she can get down to 240 which is her orthopedic surgeon's bmi cutoff for TKR. She is having hunger pains and less restriction, but feels like she is still eating mostly on track. Not tracking her protein or calories per se. Energy level is high, and notes that her sleep is poor- some times she will stay up over 24 hours because she just doesn't feel tired.   Last visit: She is about 6 months status post sleeve.  At her last visit in November complained of fatigue and palpitations, struggling with most solid proteins and only really tolerating seafood, not really meeting her protein and hydration goals but working hard to do so.  Was referred to cardiology but did not end up having a consultation and meant to follow-up with me in December.  She states she had her second cycle since surgery in December and it was essentially debilitating, she saw her gynecologist which was reassuring.  Since then she has had significant improvement.  She is still working to see what she tolerates as far as proteins go, using protein shakes as needed and doing better at meeting her goals.  She is able to complete her workouts although still has some fatigue she does know her energy level is better.   Review of Systems: A complete review of systems was obtained from the patient.  I have reviewed this information and discussed as appropriate with the patient.  See HPI as well for other ROS.   Medical History: Past Medical History:  Diagnosis Date  . Arthritis   . Asthma, unspecified asthma severity, unspecified whether complicated, unspecified whether persistent (HHS-HCC)   . Diabetes mellitus without complication (CMS/HHS-HCC)   . Hypertension     There is no problem list on file for this patient.   Past Surgical History:  Procedure Laterality Date  . LAPAROSCOPIC GASTRIC SLEEVE RESECTION  04/17/2021     No  Known Allergies  Current Outpatient Medications on File Prior to Visit  Medication Sig Dispense Refill  . albuterol  90 mcg/actuation inhaler albuterol  sulfate HFA 90 mcg/actuation aerosol inhaler  INHALE 2 PUFFS INTO THE LUNGS EVERY 6 HOURS AS NEEDED FOR WHEEZING    . amLODIPine  (NORVASC ) 10 MG tablet amlodipine  10 mg tablet  TAKE 1 TABLET BY MOUTH EVERY DAY    . carvediloL  (COREG ) 25 MG tablet Take by mouth    . fluticasone propionate (FLONASE) 50 mcg/actuation nasal spray fluticasone propionate 50 mcg/actuation nasal spray,suspension  SHAKE LIQUID AND USE 2 SPRAYS IN EACH NOSTRIL DAILY    . FUROsemide (LASIX) 20 MG tablet furosemide 20 mg tablet    . montelukast (SINGULAIR) 10 mg tablet montelukast 10 mg tablet    . simvastatin (ZOCOR) 10 MG tablet simvastatin 10 mg tablet    . topiramate  (TOPAMAX ) 50 MG tablet Take by  mouth    . triamcinolone 0.025 % cream triamcinolone acetonide 0.025 % topical cream    . DULoxetine  (CYMBALTA ) 30 MG DR capsule duloxetine  30 mg capsule,delayed release    . gabapentin  (NEURONTIN ) 800 MG tablet gabapentin  800 mg tablet  TAKE 1 TABLET BY MOUTH THREE TIMES A DAY (Patient not taking: Reported on 12/19/2022)    . losartan  (COZAAR ) 50 MG tablet Take 1 tablet (50 mg total) by mouth once daily 30 tablet 11  . metFORMIN  (GLUCOPHAGE -XR) 500 MG XR tablet metformin  ER 500 mg tablet,extended release 24 hr    . pioglitazone (ACTOS) 30 MG tablet pioglitazone 30 mg tablet (Patient not taking: Reported on 03/05/2024)    . triamterene-hydrochlorothiazide (MAXZIDE-25) 37.5-25 mg tablet triamterene 37.5 mg-hydrochlorothiazide 25 mg tablet  TAKE 1 TABLET BY MOUTH EVERY DAY (Patient not taking: Reported on 03/05/2024)     No current facility-administered medications on file prior to visit.    History reviewed. No pertinent family history.   Social History   Tobacco Use  Smoking Status Never  Smokeless Tobacco Never     Social History   Socioeconomic History  . Marital  status: Married  Tobacco Use  . Smoking status: Never  . Smokeless tobacco: Never  Vaping Use  . Vaping status: Never Used  Substance and Sexual Activity  . Alcohol use: Yes  . Drug use: Defer  . Sexual activity: Defer   Social Drivers of Health   Financial Resource Strain: Low Risk  (02/25/2024)   Received [facility]   Overall Financial Resource Strain (CARDIA)   . How hard is it for you to pay for the very basics like food, housing, medical care, and heating?: Not hard at all  Food Insecurity: No Food Insecurity (02/25/2024)   Received from [facility]   Hunger Vital Sign   . Within the past 12 months, you worried that your food would run out before you got the money to buy more.: Never true   . Within the past 12 months, the food you bought just didn't last and you didn't have money to get more.: Never true  Transportation Needs: No Transportation Needs (02/25/2024)   Received from [facility] - Transportation   . In the past 12 months, has lack of transportation kept you from medical appointments or from getting medications?: No   . In the past 12 months, has lack of transportation kept you from meetings, work, or from getting things needed for daily living?: No  Physical Activity: Sufficiently Active (02/25/2024)   Received from [facility]   Exercise Vital Sign   . On average, how many days per week do you engage in moderate to strenuous exercise (like a brisk walk)?: 6 days   . On average, how many minutes do you engage in exercise at this level?: 90 min  Stress: No Stress Concern Present (02/25/2024)   Received from [facility] - Occupational Stress Questionnaire   . Do you feel stress - tense, restless, nervous, or anxious, or unable to sleep at night because your mind is troubled all the time - these days?: Not at all  Social Connections: Moderately Integrated (02/25/2024)   Received from [facility]   Social  Connection and Isolation Panel   . In a typical week, how many times do you talk on the phone with family, friends, or neighbors?: Three times a week   . How often do you get together  with friends or relatives?: Once a week   . How often do you attend church or religious services?: 1 to 4 times per year   . Do you belong to any clubs or organizations such as church groups, unions, fraternal or athletic groups, or school groups?: No   . Are you married, widowed, divorced, separated, never married, or living with a partner?: Married  Housing Stability: Unknown (03/05/2024)   Housing Stability Vital Sign   . Homeless in the Last Year: No    Objective:    Vitals:   03/05/24 1133  BP: (!) 148/84  Pulse: 78  Temp: 36.9 C (98.4 F)  SpO2: 98%  Weight: (!) 130.2 kg (287 lb)  Height: 165.1 cm (5' 5)  PainSc: 0-No pain      Body mass index is 47.76 kg/m.  Alert and well-appearing Unlabored respirations  Assessment and Plan:  Diagnoses and all orders for this visit:  History of sleeve gastrectomy  Gastroesophageal reflux disease without esophagitis   Continues to suffer greatly on a daily and essentially constant basis from intractable reflux despite multimodal antireflux therapy, antireflux eating, sleep position changes, etc. No significant improvement with PPI, Carafate, Reglan , behavioral/eating changes.  Symptoms correlate with positive pH probe.   I continue to recommend conversion to Roux-en-Y configuration to treat her reflux.  This is the standard of care to treat this disease at this point given failure of medical and lifestyle interventions.   So far despite multiple attempts/peer to peer conversations, this has not been approved by her insurance company.  An appeal is currently pending   I have previously discussed the surgery with her and we discussed risks including bleeding, infection, pain, scarring, injury to intra-abdominal structures, anastomotic  bleed/leak/stricture, marginal ulcer, internal hernia, nutritional deficiency, chronic abdominal pain or nausea, and discussed that this is not anticipated to induce a tremendous amount of further weight loss.  We also discussed cardiovascular/pulmonary/thromboembolic risks.  Questions were welcomed and answered to her satisfaction.  She is agreeable to proceed.    I do appreciate the care she is getting at the obesity medicine clinic and would encourage that to continue as I suspect she will continue to need adjuvant medication therapy even if she does get converted to a gastric bypass.  Day of surgery 04/17/21: 389.6lb 1 month: 374.6lb 2 Months: 366.4lb 6 month: 332lb 05/16/22: 310 05/30/23: 310.4 8/7/245: 287 BMI 30: 180lb Maureen ALAN FREUND, MD

## 2024-03-06 ENCOUNTER — Encounter: Payer: Self-pay | Admitting: Family Medicine

## 2024-03-10 ENCOUNTER — Encounter: Payer: Self-pay | Admitting: Family Medicine

## 2024-03-10 ENCOUNTER — Ambulatory Visit: Admitting: Family Medicine

## 2024-03-10 VITALS — BP 142/90 | HR 68 | Temp 98.1°F | Ht 65.0 in | Wt 285.0 lb

## 2024-03-10 DIAGNOSIS — G43811 Other migraine, intractable, with status migrainosus: Secondary | ICD-10-CM | POA: Diagnosis not present

## 2024-03-10 DIAGNOSIS — R300 Dysuria: Secondary | ICD-10-CM

## 2024-03-10 DIAGNOSIS — R350 Frequency of micturition: Secondary | ICD-10-CM | POA: Diagnosis not present

## 2024-03-10 LAB — URINALYSIS, ROUTINE W REFLEX MICROSCOPIC
Bilirubin Urine: NEGATIVE
Glucose, UA: NEGATIVE
Hgb urine dipstick: NEGATIVE
Hyaline Cast: NONE SEEN /LPF
Ketones, ur: NEGATIVE
Nitrite: NEGATIVE
Protein, ur: NEGATIVE
RBC / HPF: NONE SEEN /HPF (ref 0–2)
Specific Gravity, Urine: 1.015 (ref 1.001–1.035)
pH: 6.5 (ref 5.0–8.0)

## 2024-03-10 LAB — MICROSCOPIC MESSAGE

## 2024-03-10 MED ORDER — TOPIRAMATE 25 MG PO TABS: 25.0000 mg | ORAL_TABLET | Freq: Three times a day (TID) | ORAL | 1 refills | Status: AC

## 2024-03-10 MED ORDER — PHENAZOPYRIDINE HCL 100 MG PO TABS: 100.0000 mg | ORAL_TABLET | Freq: Three times a day (TID) | ORAL | 0 refills | Status: AC | PRN

## 2024-03-10 MED ORDER — CIPROFLOXACIN HCL 500 MG PO TABS: 500.0000 mg | ORAL_TABLET | Freq: Two times a day (BID) | ORAL | 0 refills | Status: AC

## 2024-03-10 NOTE — Progress Notes (Signed)
 Patient Office Visit  Assessment & Plan:  Dysuria -     Urinalysis, Routine w reflex microscopic -     Urine Culture -     Ciprofloxacin  HCl; Take 1 tablet (500 mg total) by mouth 2 (two) times daily for 7 days.  Dispense: 14 tablet; Refill: 0 -     Phenazopyridine  HCl; Take 1 tablet (100 mg total) by mouth 3 (three) times daily as needed for up to 10 days for pain.  Dispense: 30 tablet; Refill: 0  Urinary frequency -     Urinalysis, Routine w reflex microscopic -     Urine Culture -     Ciprofloxacin  HCl; Take 1 tablet (500 mg total) by mouth 2 (two) times daily for 7 days.  Dispense: 14 tablet; Refill: 0 -     Phenazopyridine  HCl; Take 1 tablet (100 mg total) by mouth 3 (three) times daily as needed for up to 10 days for pain.  Dispense: 30 tablet; Refill: 0  Other migraine with status migrainosus, intractable -     Topiramate ; Take 1 tablet (25 mg total) by mouth 3 (three) times daily.  Dispense: 270 tablet; Refill: 1  Other orders -     Urinalysis, Routine w reflex microscopic -     Microscopic Message   Assessment and Plan        Increase the Topamax  to 75 mg/day-patient can take 2 in the morning 1 in the PM.  Patient does have tramadol  hand and can use that for acute headache.  If no improvement she is to notify us .  Follow-up on urine culture results and notify patient.  If migraine headaches do not improve she will need to see neurology again regarding this.   No follow-ups on file.   Subjective:    Patient ID: Maureen Flores, female    DOB: 1971/06/09  Age: 53 y.o. MRN: [id number]  Chief Complaint  Patient presents with   Urinary Frequency    X 3 weeks.    Urinary Frequency  Associated symptoms include frequency.   History of Present Illness          Patient is here for 2 issues- Patient has been having urinary frequency dysuria for the last 3 weeks.  No fever chills nausea vomiting back pain or gross hematuria.  Patient has not had a history of chronic UTIs  or kidney stones.  Patient initially thought if she was drinking too much water  but realized that these symptoms could be a possible infection.  Patient has not used any over-the-counter medications. Migraine headaches-patient has had a bad migraine since Friday.  Associated with photophobia but no nausea or vomiting.  Patient has previously seen neurology regarding this and was prescribed Topamax  25 twice a day and a triptan Maxalt  as needed.  Patient believes the last refill she got was from nurse practitioner at [facility] in June.  Patient typically gets 1 headache per month but recently has been having more and has not had a headache like this in a long time.  Patient not sure what the triggers are. Reflux-patient not sure if her insurance will cover Roux-en-Y procedure.  Patient has had severe GERD since the gastrectomy.  Patient did see her general surgeon regarding this recently and was told that she could have hiatal hernia surgery however it would not fix her GERD issue.   The ASCVD Risk score (Arnett DK, et al., 2019) failed to calculate for the following reasons:   The valid total cholesterol  range is 130 to 320 mg/dL  Past Medical History:  Diagnosis Date   Allergy    Coconut   Anemia    Arthritis    Asthma    Back pain    Diabetes mellitus without complication (HCC)    type 2   Female infertility    Food allergy    GERD (gastroesophageal reflux disease) 2023   Headache    Hypertension    Joint pain    Migraines    Obesity    Osteoarthritis    Prediabetes    SOB (shortness of breath)    Past Surgical History:  Procedure Laterality Date   40 HOUR PH STUDY N/A 05/15/2023   Procedure: 24 HOUR PH STUDY;  Surgeon: Shila Gustav GAILS, MD;  Location: [facility];  Service: Gastroenterology;  Laterality: N/A;   CESAREAN SECTION     ESOPHAGEAL MANOMETRY N/A 05/15/2023   Procedure: ESOPHAGEAL MANOMETRY (EM);  Surgeon: Shila Gustav GAILS, MD;  Location: [facility];  Service:  Gastroenterology;  Laterality: N/A;   LAPAROSCOPIC GASTRIC SLEEVE RESECTION N/A 04/17/2021   Procedure: LAPAROSCOPIC GASTRIC SLEEVE RESECTION;  Surgeon: [name], MD;  Location: [facility];  Service: General;  Laterality: N/A;   UPPER GI ENDOSCOPY N/A 04/17/2021   Procedure: UPPER GI ENDOSCOPY;  Surgeon: [name], MD;  Location: [facility];  Service: General;  Laterality: N/A;   WISDOM TOOTH EXTRACTION     Social History   Tobacco Use   Smoking status: Never   Smokeless tobacco: Never   Tobacco comments:    Never  Vaping Use   Vaping status: Never Used  Substance Use Topics   Alcohol use: Not Currently    Comment: social wine   Drug use: No   Family History  Problem Relation Age of Onset   Stroke Mother    Thyroid disease Mother    Sleep apnea Mother    Alcoholism Mother    Drug abuse Mother    Asthma Mother    COPD Mother    Allergies  Allergen Reactions   Coconut (Cocos Nucifera) Rash    hives   Other     Cocunut    Review of Systems  Genitourinary:  Positive for frequency.      Objective:    BP (!) 142/90   Pulse 68   Temp 98.1 F (36.7 C)   Ht 5' 5 (1.651 m)   Wt 285 lb (129.3 kg)   LMP  (LMP Unknown)   SpO2 98%   BMI 47.43 kg/m  BP Readings from Last 3 Encounters:  03/10/24 (!) 142/90  02/28/24 138/88  09/13/23 (!) 162/96   Wt Readings from Last 3 Encounters:  03/10/24 285 lb (129.3 kg)  02/28/24 285 lb (129.3 kg)  02/26/24 286 lb 6.4 oz (129.9 kg)    Physical Exam Vitals and nursing note reviewed.  Constitutional:      Appearance: Normal appearance.     Comments: In a dark room due to acute migraine  HENT:     Head: Normocephalic.     Right Ear: Tympanic membrane, ear canal and external ear normal.     Left Ear: Tympanic membrane, ear canal and external ear normal.  Eyes:     Extraocular Movements: Extraocular movements intact.     Pupils: Pupils are equal, round, and reactive to light.     Comments: Photophobia today   Cardiovascular:     Rate and Rhythm: Normal rate and regular rhythm.  Heart sounds: Normal heart sounds.  Pulmonary:     Effort: Pulmonary effort is normal.     Breath sounds: Normal breath sounds.  Musculoskeletal:     Right lower leg: Edema present.     Left lower leg: Edema present.     Comments: Bilateral ankle edema  Neurological:     General: No focal deficit present.     Mental Status: She is alert and oriented to person, place, and time.  Psychiatric:        Mood and Affect: Mood normal.        Behavior: Behavior normal.      Results for orders placed or performed in visit on 03/10/24  Urinalysis, Routine w reflex microscopic  Result Value Ref Range   Color, Urine YELLOW YELLOW   APPearance CLEAR CLEAR   Specific Gravity, Urine 1.015 1.001 - 1.035   pH 6.5 5.0 - 8.0   Glucose, UA NEGATIVE NEGATIVE   Bilirubin Urine NEGATIVE NEGATIVE   Ketones, ur NEGATIVE NEGATIVE   Hgb urine dipstick NEGATIVE NEGATIVE   Protein, ur NEGATIVE NEGATIVE   Nitrite NEGATIVE NEGATIVE   Leukocytes,Ua 1+ (A) NEGATIVE   WBC, UA 0-5 0 - 5 /HPF   RBC / HPF NONE SEEN 0 - 2 /HPF   Squamous Epithelial / HPF 0-5 < OR = 5 /HPF   Bacteria, UA MODERATE (A) NONE SEEN /HPF   Hyaline Cast NONE SEEN NONE SEEN /LPF  Microscopic Message  Result Value Ref Range   Note

## 2024-03-12 LAB — URINE CULTURE
MICRO NUMBER:: 16819618
SPECIMEN QUALITY:: ADEQUATE

## 2024-03-13 ENCOUNTER — Ambulatory Visit: Payer: Self-pay | Admitting: Family Medicine

## 2024-03-24 ENCOUNTER — Ambulatory Visit
Admission: RE | Admit: 2024-03-24 | Discharge: 2024-03-24 | Disposition: A | Discharge status: Home / Self Care | Source: Ambulatory Visit | Attending: Family Medicine | Admitting: Family Medicine

## 2024-03-24 DIAGNOSIS — Z1231 Encounter for screening mammogram for malignant neoplasm of breast: Secondary | ICD-10-CM

## 2024-04-02 ENCOUNTER — Encounter: Payer: Self-pay | Admitting: Family Medicine

## 2024-04-03 ENCOUNTER — Other Ambulatory Visit: Payer: Self-pay

## 2024-04-03 MED ORDER — AMLODIPINE BESYLATE 10 MG PO TABS
10.0000 mg | ORAL_TABLET | Freq: Every day | ORAL | 1 refills | Status: AC
Start: 2024-04-03 — End: ?

## 2024-05-22 NOTE — Telephone Encounter (Signed)
 Appointment needed prior to additional refills.

## 2024-06-01 ENCOUNTER — Encounter: Payer: Self-pay | Admitting: Radiology

## 2024-06-05 ENCOUNTER — Encounter: Payer: Self-pay | Admitting: Family Medicine

## 2024-06-05 ENCOUNTER — Ambulatory Visit: Admitting: Family Medicine

## 2024-06-05 VITALS — BP 150/90 | HR 68 | Temp 98.2°F | Ht 65.0 in | Wt 296.2 lb

## 2024-06-05 DIAGNOSIS — R350 Frequency of micturition: Secondary | ICD-10-CM

## 2024-06-05 DIAGNOSIS — E785 Hyperlipidemia, unspecified: Secondary | ICD-10-CM | POA: Diagnosis not present

## 2024-06-05 DIAGNOSIS — Z23 Encounter for immunization: Secondary | ICD-10-CM

## 2024-06-05 DIAGNOSIS — I10 Essential (primary) hypertension: Secondary | ICD-10-CM

## 2024-06-05 DIAGNOSIS — R600 Localized edema: Secondary | ICD-10-CM | POA: Diagnosis not present

## 2024-06-05 DIAGNOSIS — Z1211 Encounter for screening for malignant neoplasm of colon: Secondary | ICD-10-CM

## 2024-06-05 DIAGNOSIS — R7309 Other abnormal glucose: Secondary | ICD-10-CM

## 2024-06-05 LAB — URINALYSIS, ROUTINE W REFLEX MICROSCOPIC
Bilirubin Urine: NEGATIVE
Glucose, UA: NEGATIVE
Hgb urine dipstick: NEGATIVE
Ketones, ur: NEGATIVE
Leukocytes,Ua: NEGATIVE
Nitrite: NEGATIVE
Protein, ur: NEGATIVE
Specific Gravity, Urine: 1.015 (ref 1.001–1.035)
pH: 6.5 (ref 5.0–8.0)

## 2024-06-05 NOTE — Progress Notes (Signed)
 Patient Office Visit  Assessment & Plan:  Primary hypertension -     CBC with Differential/Platelet -     Comprehensive metabolic panel with GFR  Urinary frequency -     Urinalysis, Routine w reflex microscopic -     Urine Culture; Future  Immunization due -     Flu vaccine trivalent PF, 6mos and older(Flulaval,Afluria,Fluarix,Fluzone)  Edema of extremities -     CBC with Differential/Platelet -     Comprehensive metabolic panel with GFR  Hyperlipidemia, unspecified hyperlipidemia type -     Lipid panel  Elevated glucose -     Hemoglobin A1c  Screening for colorectal cancer -     Ambulatory referral to Gastroenterology   Assessment and Plan    Urinary urgency and frequency Persistent urgency without infection symptoms. Previous UTI treated, symptoms unresolved. Awaiting culture results. - Sent urine for culture. - Await culture results before initiating antibiotic therapy.  Localized leg edema Intermittent swelling managed with furosemide. Compression socks uncomfortable. - Continue furosemide as needed. - Explore alternative compression socks.  Migraine Headaches Stress-related increase in frequency. Topamax  effective. - Continue Topamax  regimen.  Obesity Weight gain linked to gabapentin  and diet. Limited activity due to caregiving. - Encouraged home cooking and healthy eating.  Gastroesophageal reflux disease and hiatal hernia Scheduled for revision surgery with insurance approval. - Proceed with scheduled surgery on December 16th.  Hyperlipidemia Cholesterol levels well-controlled. - Continue current management and monitoring.  General Health Maintenance Mammograms up to date. Colonoscopy scheduling resolved. - Administered flu shot. - Ordered blood work. - Will schedule colonoscopy.     Patient will likely come in after her upcoming surgery.      Return if symptoms worsen or fail to improve.   Subjective:    Patient ID: Maureen Flores,  female    DOB: [birth year]  Age: 53 y.o. MRN: [id number]  No chief complaint on file.   HPI Discussed the use of AI scribe software for clinical note transcription with the patient, who gave verbal consent to proceed.  History of Present Illness   Maureen Flores is a 53 year old female who presents for follow-up and management of knee pain and urinary issues, hypertension, LE edema and weight issues.   She has ongoing knee pain for which she takes gabapentin  800mg , although not at the prescribed dose. She takes one to two pills a day, which she feels has contributed to weight gain. 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 patient has gained some weight as a result of this.   She experiences urinary urgency, describing it as 'peeing before getting to the bathroom.' No burning sensation, discharge, or dark urine. She has been using probiotics and feminine washes to address this. She previously had a Klebsellia UTI treated with Cipro , which improved but did not completely resolve her symptoms. She continues to wear incontinence pads due to uncertainty about making it to the bathroom in time.  She is preparing for a surgery scheduled for December 16th, which includes a revision due to reflux and a hiatal hernia repair. She has faced delays in scheduling this surgery due to insurance issues, which have now been resolved.  She has been experiencing swelling in her legs, which she manages with furosemide as needed. She is also looking for better compression socks as her current ones become uncomfortable after prolonged use.  She takes Topamax  for headaches, two in the morning and one in the evening, which she feels has been effective despite increased stress-related headaches. She is under significant stress due to her husband's undiagnosed health issues, which have left him  unable to work and in constant pain.  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 She has not been cooking at home or working out, contributing to her weight gain. She is grateful for her supervisor's flexibility in allowing her to work from home.     History of Present Illness Maureen Flores is a 53 year old female who presents for follow-up and management of knee pain and urinary issues, hypertension, LE edema and weight issues.   She has ongoing knee pain for which she takes gabapentin  800mg , although not at the prescribed dose. She takes one to two pills a day, which she feels has contributed to weight gain. 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 patient has gained some weight as a result of this.   She experiences urinary urgency, describing it as 'peeing before getting to the bathroom.' No burning sensation, discharge, or dark urine. She has been using probiotics and feminine washes to address this. She previously had a Klebsellia UTI treated with Cipro , which improved but did not completely resolve her symptoms. She continues to wear incontinence pads due to uncertainty about making it to the bathroom in time.  She is preparing for a surgery scheduled for December 16th, which includes a revision due to reflux and a hiatal hernia repair. She has faced delays in scheduling this surgery due to insurance issues, which have now been resolved.  She has been experiencing swelling in her legs, which she manages with furosemide as needed. She is also looking for better compression socks as her current ones become uncomfortable after prolonged use.  She takes Topamax  for headaches, two in the morning and one in the evening, which she feels has been effective despite increased stress-related headaches. She  is under significant stress due to her husband's undiagnosed health issues, which have left him unable to work and in constant pain.  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 She has not been cooking at home or working out, contributing to her weight gain. She is grateful for her supervisor's flexibility in allowing her to work from home.  Results RADIOLOGY Lumbar spine MRI: No fracture visible, possible muscle spasm obscuring view (06/04/2024) Mammogram: Normal (02/2024)  Assessment and Plan Urinary urgency and frequency Persistent urgency without infection symptoms. Previous UTI treated, symptoms unresolved. Awaiting culture results. - Sent urine for culture. - Await culture results before initiating antibiotic therapy.  Localized leg edema Intermittent swelling managed with furosemide. Compression socks uncomfortable. - Continue furosemide as needed. - Explore alternative compression socks.  Migraine Headaches Stress-related increase in frequency. Topamax  effective. - Continue Topamax  regimen.  Obesity Weight gain linked to gabapentin  and diet. Limited activity due to caregiving. - Encouraged home cooking and healthy eating.  Gastroesophageal reflux disease and hiatal hernia Scheduled for revision surgery with insurance approval. - Proceed with scheduled surgery on December 16th.  Hyperlipidemia Cholesterol levels well-controlled. - Continue current management and monitoring.  General Health Maintenance Mammograms up to date.  Colonoscopy scheduling resolved. - Administered flu shot. - Ordered blood work. - Will schedule colonoscopy.    The ASCVD Risk score (Arnett DK, et al., 2019) failed to calculate for the following reasons:   The valid total cholesterol range is 130 to 320 mg/dL  Past Medical History:  Diagnosis Date   Allergy    Coconut   Anemia    Arthritis    Asthma    Back pain     Diabetes mellitus without complication (HCC)    type 2   Female infertility    Food allergy    GERD (gastroesophageal reflux disease) 2023   Headache    Hypertension    Joint pain    Migraines    Obesity    Osteoarthritis    Prediabetes    SOB (shortness of breath)    Past Surgical History:  Procedure Laterality Date   71 HOUR PH STUDY N/A 05/15/2023   Procedure: 24 HOUR PH STUDY;  Surgeon: Shila Gustav GAILS, MD;  Location: [facility];  Service: Gastroenterology;  Laterality: N/A;   CESAREAN SECTION     ESOPHAGEAL MANOMETRY N/A 05/15/2023   Procedure: ESOPHAGEAL MANOMETRY (EM);  Surgeon: Shila Gustav GAILS, MD;  Location: [facility];  Service: Gastroenterology;  Laterality: N/A;   LAPAROSCOPIC GASTRIC SLEEVE RESECTION N/A 04/17/2021   Procedure: LAPAROSCOPIC GASTRIC SLEEVE RESECTION;  Surgeon: [name], MD;  Location: [facility];  Service: General;  Laterality: N/A;   UPPER GI ENDOSCOPY N/A 04/17/2021   Procedure: UPPER GI ENDOSCOPY;  Surgeon: [name], MD;  Location: [facility];  Service: General;  Laterality: N/A;   WISDOM TOOTH EXTRACTION     Social History   Tobacco Use   Smoking status: Never   Smokeless tobacco: Never   Tobacco comments:    Never  Vaping Use   Vaping status: Never Used  Substance Use Topics   Alcohol use: Not Currently    Comment: social wine   Drug use: No   Family History  Problem Relation Age of Onset   Stroke Mother    Thyroid disease Mother    Sleep apnea Mother    Alcoholism Mother    Drug abuse Mother    Asthma Mother    COPD Mother    Allergies  Allergen Reactions   Coconut (Cocos Nucifera) Rash    hives   Other     Cocunut    ROS    Objective:    BP (!) 150/90   Pulse 68   Temp 98.2 F (36.8 C)   Ht 5' 5 (1.651 m)   Wt 296 lb 4 oz (134.4 kg)   SpO2 99%   BMI 49.30 kg/m  BP Readings from Last 3 Encounters:  06/05/24 (!) 150/90  03/10/24 (!) 142/90  02/28/24 138/88   Wt Readings from Last 3  Encounters:  06/05/24 296 lb 4 oz (134.4 kg)  03/10/24 285 lb (129.3 kg)  02/28/24 285 lb (129.3 kg)    Physical Exam Vitals and nursing note reviewed.  Constitutional:      General: She is not in acute distress.    Appearance: Normal appearance.  HENT:     Head: Normocephalic.     Right Ear: Tympanic membrane, ear canal and external ear normal.     Left Ear: Tympanic membrane, ear canal and external ear normal.  Eyes:     Extraocular Movements: Extraocular movements intact.     Conjunctiva/sclera: Conjunctivae normal.     Pupils: Pupils are  equal, round, and reactive to light.  Cardiovascular:     Rate and Rhythm: Normal rate and regular rhythm.     Heart sounds: Normal heart sounds.  Pulmonary:     Effort: Pulmonary effort is normal.     Breath sounds: Normal breath sounds. No wheezing.  Musculoskeletal:     Right lower leg: Edema present.     Left lower leg: Edema present.     Comments: Bilateral ankle swelling but is able to wear shoes  Skin:    Comments: Lower legs- postinflammatory changes but has decreased since weight loss. No skin breakdown  Neurological:     General: No focal deficit present.     Mental Status: She is alert and oriented to person, place, and time.  Psychiatric:        Mood and Affect: Mood normal.        Behavior: Behavior normal.        Thought Content: Thought content normal.        Judgment: Judgment normal.      Results for orders placed or performed in visit on 06/05/24  Urinalysis, Routine w reflex microscopic  Result Value Ref Range   Color, Urine YELLOW YELLOW   APPearance CLEAR CLEAR   Specific Gravity, Urine 1.015 1.001 - 1.035   pH 6.5 5.0 - 8.0   Glucose, UA NEGATIVE NEGATIVE   Bilirubin Urine NEGATIVE NEGATIVE   Ketones, ur NEGATIVE NEGATIVE   Hgb urine dipstick NEGATIVE NEGATIVE   Protein, ur NEGATIVE NEGATIVE   Nitrite NEGATIVE NEGATIVE   Leukocytes,Ua NEGATIVE NEGATIVE

## 2024-06-06 LAB — COMPREHENSIVE METABOLIC PANEL WITH GFR
AG Ratio: 1.3 (calc) (ref 1.0–2.5)
ALT: 8 U/L (ref 6–29)
AST: 13 U/L (ref 10–35)
Albumin: 3.9 g/dL (ref 3.6–5.1)
Alkaline phosphatase (APISO): 75 U/L (ref 37–153)
BUN/Creatinine Ratio: 20 (calc) (ref 6–22)
BUN: 21 mg/dL (ref 7–25)
CO2: 27 mmol/L (ref 20–32)
Calcium: 9.6 mg/dL (ref 8.6–10.4)
Chloride: 107 mmol/L (ref 98–110)
Creat: 1.04 mg/dL — ABNORMAL HIGH (ref 0.50–1.03)
Globulin: 2.9 g/dL (ref 1.9–3.7)
Glucose, Bld: 80 mg/dL (ref 65–99)
Potassium: 3.8 mmol/L (ref 3.5–5.3)
Sodium: 143 mmol/L (ref 135–146)
Total Bilirubin: 0.3 mg/dL (ref 0.2–1.2)
Total Protein: 6.8 g/dL (ref 6.1–8.1)
eGFR: 64 mL/min/1.73m2 (ref >=60)

## 2024-06-06 LAB — CBC WITH DIFFERENTIAL/PLATELET
Absolute Lymphocytes: 2758 {cells}/uL (ref 850–3900)
Absolute Monocytes: 560 {cells}/uL (ref 200–950)
Basophils Absolute: 91 {cells}/uL (ref 0–200)
Basophils Relative: 1.3 %
Eosinophils Absolute: 301 {cells}/uL (ref 15–500)
Eosinophils Relative: 4.3 %
HCT: 39.7 % (ref 35.0–45.0)
Hemoglobin: 12.8 g/dL (ref 11.7–15.5)
MCH: 29 pg (ref 27.0–33.0)
MCHC: 32.2 g/dL (ref 32.0–36.0)
MCV: 89.8 fL (ref 80.0–100.0)
MPV: 12 fL (ref 7.5–12.5)
Monocytes Relative: 8 %
Neutro Abs: 3290 {cells}/uL (ref 1500–7800)
Neutrophils Relative %: 47 %
Platelets: 264 Thousand/uL (ref 140–400)
RBC: 4.42 Million/uL (ref 3.80–5.10)
RDW: 12.6 % (ref 11.0–15.0)
Total Lymphocyte: 39.4 %
WBC: 7 Thousand/uL (ref 3.8–10.8)

## 2024-06-06 LAB — HEMOGLOBIN A1C
Hgb A1c MFr Bld: 5.1 % (ref <5.7)
Mean Plasma Glucose: 100 mg/dL
eAG (mmol/L): 5.5 mmol/L

## 2024-06-06 LAB — URINE CULTURE
MICRO NUMBER:: 17206503
Result:: NO GROWTH
SPECIMEN QUALITY:: ADEQUATE

## 2024-06-06 LAB — LIPID PANEL
Cholesterol: 148 mg/dL (ref <200)
HDL: 44 mg/dL — ABNORMAL LOW (ref >=50)
LDL Cholesterol (Calc): 81 mg/dL
Non-HDL Cholesterol (Calc): 104 mg/dL (ref <130)
Total CHOL/HDL Ratio: 3.4 (calc) (ref <5.0)
Triglycerides: 132 mg/dL (ref <150)

## 2024-06-08 ENCOUNTER — Ambulatory Visit: Payer: Self-pay | Admitting: Family Medicine

## 2024-06-16 ENCOUNTER — Telehealth: Payer: Self-pay | Admitting: Dietician

## 2024-06-16 NOTE — Telephone Encounter (Signed)
 Called patient to answer questions about the pre-bariatric surgery diet that starts two weeks before surgery.  Left Voice Message with call back number.

## 2024-06-17 ENCOUNTER — Encounter: Payer: Self-pay | Admitting: Family Medicine

## 2024-06-21 ENCOUNTER — Ambulatory Visit: Payer: Self-pay | Admitting: Surgery

## 2024-06-21 DIAGNOSIS — K219 Gastro-esophageal reflux disease without esophagitis: Secondary | ICD-10-CM

## 2024-06-27 ENCOUNTER — Encounter: Payer: Self-pay | Admitting: Family Medicine

## 2024-06-27 ENCOUNTER — Other Ambulatory Visit: Payer: Self-pay | Admitting: Family Medicine

## 2024-06-29 ENCOUNTER — Encounter: Attending: Surgery | Admitting: Dietician

## 2024-06-29 ENCOUNTER — Other Ambulatory Visit: Payer: Self-pay

## 2024-06-29 ENCOUNTER — Encounter: Payer: Self-pay | Admitting: Dietician

## 2024-06-29 DIAGNOSIS — E669 Obesity, unspecified: Secondary | ICD-10-CM | POA: Diagnosis present

## 2024-06-29 DIAGNOSIS — K219 Gastro-esophageal reflux disease without esophagitis: Secondary | ICD-10-CM | POA: Insufficient documentation

## 2024-06-29 NOTE — H&P (View-Only) (Signed)
 Maureen Flores [id number]   Referring Provider:  Self   Subjective   Chief Complaint: RETURN WEIGHT LOSS     History of Present Illness:  Returns for follow up. No changes in her health. She is ready to proceed with surgical treatment for intractable reflux.   03/05/24: She continues to suffer daily with intractable reflux despite multimodal medication therapy including H2 blocker, PPI, Carafate, and as needed antacids.  Each of these modalities seems to help transiently but not consistently.  She is gone so far as to purchase a specialty bed which will keep her head and torso elevated when she is sleeping, but to no avail as her symptoms persist.  She remains compliant with antireflux eating measures. She did have an upper endoscopy done in May of this year that demonstrates an irregular Z-line without esophagitis or Barrett's, healthy previously and healthy appearing duodenum.  She did have a laryngospasm/apnea as the procedure was ending.  Describes burning sensation in her throat that is so severe she fears she is going to develop lesions or ulcerations in this area.  He did see her primary care doctor earlier this month and had labs done that showed a mild elevation in her creatinine; she does state that she still struggles to drink enough water  but continues to work on this.  She does report urinary frequency.  She is still not able to eat much and tries to focus on eating healthy foods.  She is walking for exercise.  She has lost about 23 pounds since her last visit here.    05/30/23: She has continued to suffer tremendously with heartburn.  This is despite changing her eating patterns, sleeping propped up on pillows, Carafate, PPI.  Often has nocturnal regurgitation, at least 2 times a week.  Symptoms are aggravated even by drinking water  and in fact this morning she has had nothing to eat and is quite uncomfortable.  Had an upper GI done in April 2024 that did show a tiny  sliding hiatal hernia and mild reflux. pH probe with a DeMeester score of 26.4, overall report with evidence of significant gastroesophageal acid reflux Manometry with normal relaxation of the GE junction hypercontractile esophagus likely related to her reflux   Last visit, May of this year: follows up today after recent issues with heartburn.  She has been regaining weight.  Saw healthy weight and wellness yesterday and was back up to 318 pounds which is up about 8 pounds since I last saw her in October.  She had started gabapentin  which may have been contributing.  She has been advised to track calories and has been given a goal of 1200 cal a day with 90 g of protein. She had communicated to us  in mid March that she felt food was getting stuck in her chest no matter how much she chewed or how slow she was eating; also reported that food is pushing back up into her esophagus with some burning at the top, noted some gagging at night with substance in her throat and esophagus which have been going on since the beginning of March.  We advised to go back to liquids only for a few days and ordered an upper GI which did show a tiny hiatal hernia and mild reflux with her lying prone, but nothing severe.  We recommended keeping her on Protonix  40 mg daily, antireflux eating style (eat slowly- bites the size of a thumbnail, chew until applesauce consistency, wait 20 seconds  between bites; smaller more frequent meals, try to stand up and walk for 10 minutes after each meal); nothing to eat within 3 hours of bedtime); consider sleeping propped up on a few pillows.  Despite dietary changes, PPI and positioning changes she has continued to have daily reflux.  Has a burning sensation in her mid and upper esophageal region as well as occasional sensation of food sitting heavily in the epigastric region.  Also has been having worse pain in her hips and knees attributed to change in her gait from the weight loss she has  had.  Last visit, October 2023: She is now a little over a year s/p sleeve. Overall she is doing well, but her weight loss has stalled. Her knees are still a big issue and she is getting steroid injections periodically until she can get down to 240 which is her orthopedic surgeon's bmi cutoff for TKR. She is having hunger pains and less restriction, but feels like she is still eating mostly on track. Not tracking her protein or calories per se. Energy level is high, and notes that her sleep is poor- some times she will stay up over 24 hours because she just doesn't feel tired.   Last visit: She is about 6 months status post sleeve.  At her last visit in November complained of fatigue and palpitations, struggling with most solid proteins and only really tolerating seafood, not really meeting her protein and hydration goals but working hard to do so.  Was referred to cardiology but did not end up having a consultation and meant to follow-up with me in December.  She states she had her second cycle since surgery in December and it was essentially debilitating, she saw her gynecologist which was reassuring.  Since then she has had significant improvement.  She is still working to see what she tolerates as far as proteins go, using protein shakes as needed and doing better at meeting her goals.  She is able to complete her workouts although still has some fatigue she does know her energy level is better.   Review of Systems: A complete review of systems was obtained from the patient.  I have reviewed this information and discussed as appropriate with the patient.  See HPI as well for other ROS.   Medical History: Past Medical History:  Diagnosis Date   Arthritis    Asthma, unspecified asthma severity, unspecified whether complicated, unspecified whether persistent (HHS-HCC)    Diabetes mellitus without complication (CMS/HHS-HCC)    Hypertension     There is no problem list on file for this  patient.   Past Surgical History:  Procedure Laterality Date   LAPAROSCOPIC GASTRIC SLEEVE RESECTION  04/17/2021     No Known Allergies  Current Outpatient Medications on File Prior to Visit  Medication Sig Dispense Refill   albuterol  90 mcg/actuation inhaler albuterol  sulfate HFA 90 mcg/actuation aerosol inhaler  INHALE 2 PUFFS INTO THE LUNGS EVERY 6 HOURS AS NEEDED FOR WHEEZING     amLODIPine  (NORVASC ) 10 MG tablet amlodipine  10 mg tablet  TAKE 1 TABLET BY MOUTH EVERY DAY     carvediloL  (COREG ) 25 MG tablet Take by mouth     fluticasone propionate (FLONASE) 50 mcg/actuation nasal spray fluticasone propionate 50 mcg/actuation nasal spray,suspension  SHAKE LIQUID AND USE 2 SPRAYS IN EACH NOSTRIL DAILY     FUROsemide (LASIX) 20 MG tablet furosemide 20 mg tablet     montelukast (SINGULAIR) 10 mg tablet montelukast 10 mg tablet  pioglitazone (ACTOS) 30 MG tablet      simvastatin (ZOCOR) 10 MG tablet simvastatin 10 mg tablet     topiramate  (TOPAMAX ) 50 MG tablet Take by mouth     triamcinolone 0.025 % cream triamcinolone acetonide 0.025 % topical cream     triamterene-hydrochlorothiazide (MAXZIDE-25) 37.5-25 mg tablet      DULoxetine  (CYMBALTA ) 30 MG DR capsule duloxetine  30 mg capsule,delayed release     gabapentin  (NEURONTIN ) 800 MG tablet gabapentin  800 mg tablet  TAKE 1 TABLET BY MOUTH THREE TIMES A DAY (Patient not taking: Reported on 12/19/2022)     losartan  (COZAAR ) 50 MG tablet Take 1 tablet (50 mg total) by mouth once daily 30 tablet 11   metFORMIN  (GLUCOPHAGE -XR) 500 MG XR tablet metformin  ER 500 mg tablet,extended release 24 hr     No current facility-administered medications on file prior to visit.    History reviewed. No pertinent family history.   Social History   Tobacco Use  Smoking Status Never  Smokeless Tobacco Never     Social History   Socioeconomic History   Marital status: Married  Tobacco Use   Smoking status: Never   Smokeless tobacco: Never   Vaping Use   Vaping status: Never Used  Substance and Sexual Activity   Alcohol use: Yes   Drug use: Defer   Sexual activity: Defer   Social Drivers of Health   Financial Resource Strain: Low Risk  (05/29/2024)   Received from [facility]   Overall Financial Resource Strain (CARDIA)    How hard is it for you to pay for the very basics like food, housing, medical care, and heating?: Not hard at all  Food Insecurity: No Food Insecurity (05/29/2024)   Received from [facility]   Hunger Vital Sign    Within the past 12 months, you worried that your food would run out before you got the money to buy more.: Never true    Within the past 12 months, the food you bought just didn't last and you didn't have money to get more.: Never true  Transportation Needs: No Transportation Needs (05/29/2024)   Received from [facility] - Transportation    In the past 12 months, has lack of transportation kept you from medical appointments or from getting medications?: No    In the past 12 months, has lack of transportation kept you from meetings, work, or from getting things needed for daily living?: No  Physical Activity: Sufficiently Active (05/29/2024)   Received from [facility]   Exercise Vital Sign    On average, how many days per week do you engage in moderate to strenuous exercise (like a brisk walk)?: 3 days    On average, how many minutes do you engage in exercise at this level?: 90 min  Stress: No Stress Concern Present (05/29/2024)   Received from [facility] - Occupational Stress Questionnaire    Do you feel stress - tense, restless, nervous, or anxious, or unable to sleep at night because your mind is troubled all the time - these days?: Not at all  Social Connections: Moderately Isolated (05/29/2024)   Received from [facility]   Social Connection and Isolation Panel    In a typical week, how many times do you talk on the phone with  family, friends, or neighbors?: More than three times a week    How often do you get together with friends or relatives?: Never  How often do you attend church or religious services?: Never    Do you belong to any clubs or organizations such as church groups, unions, fraternal or athletic groups, or school groups?: No    Are you married, widowed, divorced, separated, never married, or living with a partner?: Married  Housing Stability: Unknown (03/05/2024)   Housing Stability Vital Sign    Homeless in the Last Year: No    Objective:    Vitals:   06/29/24 0931 06/29/24 0932  BP: (!) 156/81   Pulse: 64   Temp: 36.7 C (98 F)   SpO2: 99%   Weight: (!) 134 kg (295 lb 6.4 oz)   Height: 165.1 cm (5' 5)   PainSc:  0-No pain      Body mass index is 49.16 kg/m.  Alert and well-appearing Unlabored respirations  Assessment and Plan:  Diagnoses and all orders for this visit:  Morbid obesity (CMS-HCC)  Gastroesophageal reflux disease, unspecified whether esophagitis present  History of sleeve gastrectomy    Continues to suffer greatly on a daily and essentially constant basis from intractable reflux despite multimodal antireflux therapy, antireflux eating, sleep position changes, etc. No significant improvement with PPI, Carafate, Reglan , behavioral/eating changes.  Symptoms correlate with positive pH probe.   I continue to recommend conversion to Roux-en-Y configuration to treat her reflux.  This is the standard of care to treat this disease at this point given failure of medical and lifestyle interventions.   She has advocated for herself exceptionally and we are now able to proceed.    I have previously discussed the surgery with her and we discussed risks including bleeding, infection, pain, scarring, injury to intra-abdominal structures, anastomotic bleed/leak/stricture, marginal ulcer, internal hernia, nutritional deficiency, chronic abdominal pain or nausea, and  discussed that this is not anticipated to induce a tremendous amount of further weight loss.  We also discussed cardiovascular/pulmonary/thromboembolic risks.  Questions were welcomed and answered to her satisfaction.  She is agreeable to proceed.    I do appreciate the care she is getting at the obesity medicine clinic and would encourage that to continue as I suspect she will continue to need adjuvant medication therapy even if she does get converted to a gastric bypass.  Sleeve gastrectomy 04/17/21: 389.6lb 1 month: 374.6lb 2 Months: 366.4lb 6 month: 332lb 05/16/22: 310 05/30/23: 310.4 03/05/24: 287 12/1:295  BMI 30: 180lb Levern Pitter ALAN FREUND, MD

## 2024-06-29 NOTE — Progress Notes (Signed)
 Pre-Operative Nutrition Education:    Start Time: 1027   End Time: 1058  I connected with Maureen Flores on 06/29/24 by a video enabled application. Patient was seen for Pre-Operative Bariatric Surgery Education at [facility]  Surgery date: 07/14/2024 Surgery type: Conversion from Sleeve to RYGB  Anthropometrics  Start weight at [facility]: 315.4 lbs (date: 06/21/2023)  Height: 65 in Weight today:  virtual appointment   Clinical   Medical hx: HTN, GERD, asthma, obesity Medications: metformin , losarten, gabapentin  Labs: A1c 5.6; chol 118; HDL 42; LDL 58 Notable signs/symptoms: none noted Any previous deficiencies? No  The following the learning objectives were met by the patient during this appointment: Identify Pre-Op Dietary Goals and will begin 2 weeks pre-operatively Identify appropriate sources of fluids and proteins  State protein recommendations and appropriate sources pre and post-operatively Identify Post-Operative Dietary Goals and will follow for 2 weeks post-operatively Identify appropriate multivitamin, calcium, and thiamin sources Describe the need for physical activity post-operatively and will follow MD recommendations State when to call healthcare provider regarding medication questions or post-operative complications When having a diagnosis of diabetes understanding hypoglycemia symptoms and the inclusion of 1 complex carbohydrate per meal  Handouts given at previous class include: Pre-Op Bariatric Surgery Diet Handout Protein Shake Handout Post-Op Bariatric Surgery Nutrition Handout BELT Program Information Flyer [employer] Information Flyer [facility] Bariatric Supplements Price List  Follow-Up Plan: Patient will follow-up at [facility] 2 weeks post operatively for diet advancement per MD.

## 2024-06-29 NOTE — H&P (Signed)
 LENKA ZHAO [id number]   Referring Provider:  Self   Subjective   Chief Complaint: RETURN WEIGHT LOSS     History of Present Illness:  Returns for follow up. No changes in her health. She is ready to proceed with surgical treatment for intractable reflux.   03/05/24: She continues to suffer daily with intractable reflux despite multimodal medication therapy including H2 blocker, PPI, Carafate, and as needed antacids.  Each of these modalities seems to help transiently but not consistently.  She is gone so far as to purchase a specialty bed which will keep her head and torso elevated when she is sleeping, but to no avail as her symptoms persist.  She remains compliant with antireflux eating measures. She did have an upper endoscopy done in May of this year that demonstrates an irregular Z-line without esophagitis or Barrett's, healthy previously and healthy appearing duodenum.  She did have a laryngospasm/apnea as the procedure was ending.  Describes burning sensation in her throat that is so severe she fears she is going to develop lesions or ulcerations in this area.  He did see her primary care doctor earlier this month and had labs done that showed a mild elevation in her creatinine; she does state that she still struggles to drink enough water  but continues to work on this.  She does report urinary frequency.  She is still not able to eat much and tries to focus on eating healthy foods.  She is walking for exercise.  She has lost about 23 pounds since her last visit here.    05/30/23: She has continued to suffer tremendously with heartburn.  This is despite changing her eating patterns, sleeping propped up on pillows, Carafate, PPI.  Often has nocturnal regurgitation, at least 2 times a week.  Symptoms are aggravated even by drinking water  and in fact this morning she has had nothing to eat and is quite uncomfortable.  Had an upper GI done in April 2024 that did show a tiny  sliding hiatal hernia and mild reflux. pH probe with a DeMeester score of 26.4, overall report with evidence of significant gastroesophageal acid reflux Manometry with normal relaxation of the GE junction hypercontractile esophagus likely related to her reflux   Last visit, May of this year: follows up today after recent issues with heartburn.  She has been regaining weight.  Saw healthy weight and wellness yesterday and was back up to 318 pounds which is up about 8 pounds since I last saw her in October.  She had started gabapentin  which may have been contributing.  She has been advised to track calories and has been given a goal of 1200 cal a day with 90 g of protein. She had communicated to us  in mid March that she felt food was getting stuck in her chest no matter how much she chewed or how slow she was eating; also reported that food is pushing back up into her esophagus with some burning at the top, noted some gagging at night with substance in her throat and esophagus which have been going on since the beginning of March.  We advised to go back to liquids only for a few days and ordered an upper GI which did show a tiny hiatal hernia and mild reflux with her lying prone, but nothing severe.  We recommended keeping her on Protonix  40 mg daily, antireflux eating style (eat slowly- bites the size of a thumbnail, chew until applesauce consistency, wait 20 seconds  between bites; smaller more frequent meals, try to stand up and walk for 10 minutes after each meal); nothing to eat within 3 hours of bedtime); consider sleeping propped up on a few pillows.  Despite dietary changes, PPI and positioning changes she has continued to have daily reflux.  Has a burning sensation in her mid and upper esophageal region as well as occasional sensation of food sitting heavily in the epigastric region.  Also has been having worse pain in her hips and knees attributed to change in her gait from the weight loss she has  had.  Last visit, October 2023: She is now a little over a year s/p sleeve. Overall she is doing well, but her weight loss has stalled. Her knees are still a big issue and she is getting steroid injections periodically until she can get down to 240 which is her orthopedic surgeon's bmi cutoff for TKR. She is having hunger pains and less restriction, but feels like she is still eating mostly on track. Not tracking her protein or calories per se. Energy level is high, and notes that her sleep is poor- some times she will stay up over 24 hours because she just doesn't feel tired.   Last visit: She is about 6 months status post sleeve.  At her last visit in November complained of fatigue and palpitations, struggling with most solid proteins and only really tolerating seafood, not really meeting her protein and hydration goals but working hard to do so.  Was referred to cardiology but did not end up having a consultation and meant to follow-up with me in December.  She states she had her second cycle since surgery in December and it was essentially debilitating, she saw her gynecologist which was reassuring.  Since then she has had significant improvement.  She is still working to see what she tolerates as far as proteins go, using protein shakes as needed and doing better at meeting her goals.  She is able to complete her workouts although still has some fatigue she does know her energy level is better.   Review of Systems: A complete review of systems was obtained from the patient.  I have reviewed this information and discussed as appropriate with the patient.  See HPI as well for other ROS.   Medical History: Past Medical History:  Diagnosis Date   Arthritis    Asthma, unspecified asthma severity, unspecified whether complicated, unspecified whether persistent (HHS-HCC)    Diabetes mellitus without complication (CMS/HHS-HCC)    Hypertension     There is no problem list on file for this  patient.   Past Surgical History:  Procedure Laterality Date   LAPAROSCOPIC GASTRIC SLEEVE RESECTION  04/17/2021     No Known Allergies  Current Outpatient Medications on File Prior to Visit  Medication Sig Dispense Refill   albuterol  90 mcg/actuation inhaler albuterol  sulfate HFA 90 mcg/actuation aerosol inhaler  INHALE 2 PUFFS INTO THE LUNGS EVERY 6 HOURS AS NEEDED FOR WHEEZING     amLODIPine  (NORVASC ) 10 MG tablet amlodipine  10 mg tablet  TAKE 1 TABLET BY MOUTH EVERY DAY     carvediloL  (COREG ) 25 MG tablet Take by mouth     fluticasone propionate (FLONASE) 50 mcg/actuation nasal spray fluticasone propionate 50 mcg/actuation nasal spray,suspension  SHAKE LIQUID AND USE 2 SPRAYS IN EACH NOSTRIL DAILY     FUROsemide (LASIX) 20 MG tablet furosemide 20 mg tablet     montelukast (SINGULAIR) 10 mg tablet montelukast 10 mg tablet  pioglitazone (ACTOS) 30 MG tablet      simvastatin (ZOCOR) 10 MG tablet simvastatin 10 mg tablet     topiramate  (TOPAMAX ) 50 MG tablet Take by mouth     triamcinolone 0.025 % cream triamcinolone acetonide 0.025 % topical cream     triamterene-hydrochlorothiazide (MAXZIDE-25) 37.5-25 mg tablet      DULoxetine  (CYMBALTA ) 30 MG DR capsule duloxetine  30 mg capsule,delayed release     gabapentin  (NEURONTIN ) 800 MG tablet gabapentin  800 mg tablet  TAKE 1 TABLET BY MOUTH THREE TIMES A DAY (Patient not taking: Reported on 12/19/2022)     losartan  (COZAAR ) 50 MG tablet Take 1 tablet (50 mg total) by mouth once daily 30 tablet 11   metFORMIN  (GLUCOPHAGE -XR) 500 MG XR tablet metformin  ER 500 mg tablet,extended release 24 hr     No current facility-administered medications on file prior to visit.    History reviewed. No pertinent family history.   Social History   Tobacco Use  Smoking Status Never  Smokeless Tobacco Never     Social History   Socioeconomic History   Marital status: Married  Tobacco Use   Smoking status: Never   Smokeless tobacco: Never   Vaping Use   Vaping status: Never Used  Substance and Sexual Activity   Alcohol use: Yes   Drug use: Defer   Sexual activity: Defer   Social Drivers of Health   Financial Resource Strain: Low Risk  (05/29/2024)   Received from [facility]   Overall Financial Resource Strain (CARDIA)    How hard is it for you to pay for the very basics like food, housing, medical care, and heating?: Not hard at all  Food Insecurity: No Food Insecurity (05/29/2024)   Received from [facility]   Hunger Vital Sign    Within the past 12 months, you worried that your food would run out before you got the money to buy more.: Never true    Within the past 12 months, the food you bought just didn't last and you didn't have money to get more.: Never true  Transportation Needs: No Transportation Needs (05/29/2024)   Received from [facility] - Transportation    In the past 12 months, has lack of transportation kept you from medical appointments or from getting medications?: No    In the past 12 months, has lack of transportation kept you from meetings, work, or from getting things needed for daily living?: No  Physical Activity: Sufficiently Active (05/29/2024)   Received from [facility]   Exercise Vital Sign    On average, how many days per week do you engage in moderate to strenuous exercise (like a brisk walk)?: 3 days    On average, how many minutes do you engage in exercise at this level?: 90 min  Stress: No Stress Concern Present (05/29/2024)   Received from [facility] - Occupational Stress Questionnaire    Do you feel stress - tense, restless, nervous, or anxious, or unable to sleep at night because your mind is troubled all the time - these days?: Not at all  Social Connections: Moderately Isolated (05/29/2024)   Received from [facility]   Social Connection and Isolation Panel    In a typical week, how many times do you talk on the phone with  family, friends, or neighbors?: More than three times a week    How often do you get together with friends or relatives?: Never  How often do you attend church or religious services?: Never    Do you belong to any clubs or organizations such as church groups, unions, fraternal or athletic groups, or school groups?: No    Are you married, widowed, divorced, separated, never married, or living with a partner?: Married  Housing Stability: Unknown (03/05/2024)   Housing Stability Vital Sign    Homeless in the Last Year: No    Objective:    Vitals:   06/29/24 0931 06/29/24 0932  BP: (!) 156/81   Pulse: 64   Temp: 36.7 C (98 F)   SpO2: 99%   Weight: (!) 134 kg (295 lb 6.4 oz)   Height: 165.1 cm (5' 5)   PainSc:  0-No pain      Body mass index is 49.16 kg/m.  Alert and well-appearing Unlabored respirations  Assessment and Plan:  Diagnoses and all orders for this visit:  Morbid obesity (CMS-HCC)  Gastroesophageal reflux disease, unspecified whether esophagitis present  History of sleeve gastrectomy    Continues to suffer greatly on a daily and essentially constant basis from intractable reflux despite multimodal antireflux therapy, antireflux eating, sleep position changes, etc. No significant improvement with PPI, Carafate, Reglan , behavioral/eating changes.  Symptoms correlate with positive pH probe.   I continue to recommend conversion to Roux-en-Y configuration to treat her reflux.  This is the standard of care to treat this disease at this point given failure of medical and lifestyle interventions.   She has advocated for herself exceptionally and we are now able to proceed.    I have previously discussed the surgery with her and we discussed risks including bleeding, infection, pain, scarring, injury to intra-abdominal structures, anastomotic bleed/leak/stricture, marginal ulcer, internal hernia, nutritional deficiency, chronic abdominal pain or nausea, and  discussed that this is not anticipated to induce a tremendous amount of further weight loss.  We also discussed cardiovascular/pulmonary/thromboembolic risks.  Questions were welcomed and answered to her satisfaction.  She is agreeable to proceed.    I do appreciate the care she is getting at the obesity medicine clinic and would encourage that to continue as I suspect she will continue to need adjuvant medication therapy even if she does get converted to a gastric bypass.  Sleeve gastrectomy 04/17/21: 389.6lb 1 month: 374.6lb 2 Months: 366.4lb 6 month: 332lb 05/16/22: 310 05/30/23: 310.4 03/05/24: 287 12/1:295  BMI 30: 180lb Levern Pitter ALAN FREUND, MD

## 2024-07-01 ENCOUNTER — Ambulatory Visit: Admitting: Physician Assistant

## 2024-07-01 DIAGNOSIS — M170 Bilateral primary osteoarthritis of knee: Secondary | ICD-10-CM | POA: Diagnosis not present

## 2024-07-01 MED ORDER — LIDOCAINE HCL 1 % IJ SOLN
3.0000 mL | INTRAMUSCULAR | Status: AC | PRN
  Administered 2024-07-01: 3 mL

## 2024-07-01 MED ORDER — METHYLPREDNISOLONE ACETATE 40 MG/ML IJ SUSP
40.0000 mg | INTRAMUSCULAR | Status: AC | PRN
  Administered 2024-07-01: 40 mg via INTRA_ARTICULAR

## 2024-07-01 NOTE — Progress Notes (Signed)
   Procedure Note  Patient: Maureen Flores             Date of Birth: 09-02-1970           MRN: [id number]             Visit Date: 07/01/2024 HPI: Mrs. Freedman is well-known to Dr. Vernetta service comes in today requesting cortisone injection both knees.  She has known osteoarthritis both knees.  She has had no injuries to either knee.  Currently right knee pain is worse than her left.  She states the knee pain began approximately 3 weeks ago.  She is nondiabetic.  ROS: See HPI  Physical exam: General well-developed well-nourished female who ambulates assistive device.  Nonantalgic gait. Bilateral knees: Good range of motion no abnormal warmth erythema or effusion.  Patellofemoral crepitus with passive range of motion of both knees.  Procedures: Visit Diagnoses:  1. Bilateral primary osteoarthritis of knee     Large Joint Inj: bilateral knee on 07/01/2024 12:20 PM Indications: pain Details: 22 G 1.5 in needle, anterolateral approach  Arthrogram: No  Medications (Right): 3 mL lidocaine  1 %; 40 mg methylPREDNISolone  acetate 40 MG/ML Medications (Left): 3 mL lidocaine  1 %; 40 mg methylPREDNISolone  acetate 40 MG/ML Outcome: tolerated well, no immediate complications Procedure, treatment alternatives, risks and benefits explained, specific risks discussed. Consent was given by the patient. Immediately prior to procedure a time out was called to verify the correct patient, procedure, equipment, support staff and site/side marked as required. Patient was prepped and draped in the usual sterile fashion.     Plan: She will follow-up as needed she knows to wait at least 3 months between injections.  Questions were encouraged and answered at length.

## 2024-07-02 ENCOUNTER — Encounter (HOSPITAL_COMMUNITY): Payer: Self-pay

## 2024-07-02 NOTE — Progress Notes (Addendum)
 PCP - Aletha Bene, MD  LOV 06-05-24 epic Cardiologist - n/a  PPM/ICD -  Device Orders -  Rep Notified -   Chest x-ray -  EKG - preop Stress Test -  ECHO -  Cardiac Cath -  LABS-HgbA1c (5.1) 06-05-24  epic  Sleep Study -  CPAP -   Fasting Blood Sugar -  Checks Blood Sugar __n/a___ times a day  Blood Thinner Instructions:n/a Aspirin Instructions:n/a  ERAS Protcol - PRE-SURGERY G2-    COVID vaccine -yes  Activity--Able to climb a flight of stairs with no cp or SOB Anesthesia review: HTN, Asthma  Patient denies shortness of breath, fever, cough and chest pain at PAT appointment   All instructions explained to the patient, with a verbal understanding of the material. Patient agrees to go over the instructions while at home for a better understanding. Patient also instructed to self quarantine after being tested for COVID-19. The opportunity to ask questions was provided.

## 2024-07-02 NOTE — Patient Instructions (Signed)
 SURGICAL WAITING ROOM VISITATION  Patients having surgery or a procedure may have no more than 2 support people in the waiting area - these visitors may rotate.    Children under the age of 29 must have an adult with them who is not the patient.  Visitors with respiratory illnesses are discouraged from visiting and should remain at home.  If the patient needs to stay at the hospital during part of their recovery, the visitor guidelines for inpatient rooms apply. Pre-op nurse will coordinate an appropriate time for 1 support person to accompany patient in pre-op.  This support person may not rotate.    Please refer to the [facility] website for the visitor guidelines for Inpatients (after your surgery is over and you are in a regular room).       Your procedure is scheduled on: 07-14-24   Report to [facility] Main Entrance    Report to admitting at     0515  AM   Call this number if you have problems the morning of surgery [phone]   NO SOLID FOOD AFTER 600 PM THE NIGHT BEFORE YOUR SURGERY. YOU MAY DRINK CLEAR FLUIDS  until 0430 am     You may have the following liquids until _0430_____ AM/  DAY OF SURGERY  Water  Non-Citrus Juices (without pulp, NO RED-Apple, White grape, White cranberry) Black Coffee (NO MILK/CREAM OR CREAMERS, sugar ok)  Clear Tea (NO MILK/CREAM OR CREAMERS, sugar ok) regular and decaf                             Plain Jell-O (NO RED)                                           Fruit ices (not with fruit pulp, NO RED)                                     Popsicles (NO RED)                                                               Sports drinks like Gatorade (NO RED)                   The day of surgery:  Drink ONE (1) Pre-Surgery G2 by 0430   AM the morning of surgery. Drink in one sitting. Do not sip.  This drink was given to you during your hospital  pre-op appointment visit. Nothing else to drink after completing the  Pre-Surgery G2.           If you have questions, please contact your surgeon's office.   FOLLOW  ANY ADDITIONAL PRE OP INSTRUCTIONS YOU RECEIVED FROM YOUR SURGEON'S OFFICE!!!     Oral Hygiene is also important to reduce your risk of infection.                                    Remember - BRUSH YOUR TEETH  THE MORNING OF SURGERY WITH YOUR REGULAR TOOTHPASTE  DENTURES WILL BE REMOVED PRIOR TO SURGERY PLEASE DO NOT APPLY Poly grip OR ADHESIVES!!!   Do NOT smoke after Midnight   Stop all vitamins and herbal supplements 7 days before surgery.   Take these medicines the morning of surgery with A SIP OF WATER : Tramadol  if needed, Topiramate , Simvastatin, Sucralfate Simvastatin, famotidine, carvedilol , amlodipine , bring rescue inhaler with you.                                You may not have any metal on your body including hair pins, jewelry, and body piercing             Do not wear make-up, lotions, powders, perfumes/cologne, or deodorant  Do not wear nail polish including gel and S&S, artificial/acrylic nails, or any other type of covering on natural nails including finger and toenails. If you have artificial nails, gel coating, etc. that needs to be removed by a nail salon please have this removed prior to surgery or surgery may need to be canceled/ delayed if the surgeon/ anesthesia feels like they are unable to be safely monitored.   Do not shave  48 hours prior to surgery.             Do not bring valuables to the hospital. [facility] IS NOT             RESPONSIBLE   FOR VALUABLES.   Contacts, glasses, dentures or bridgework may not be worn into surgery.   Bring small overnight bag day of surgery.   DO NOT BRING YOUR HOME MEDICATIONS TO THE HOSPITAL. PHARMACY WILL DISPENSE MEDICATIONS LISTED ON YOUR MEDICATION LIST TO YOU DURING YOUR ADMISSION IN THE HOSPITAL!    Patients discharged on the day of surgery will not be allowed to drive home.  Someone NEEDS to stay with you for the first 24  hours after anesthesia.   Special Instructions: Bring a copy of your healthcare power of attorney and living will documents the day of surgery if you haven't scanned them before.              Please read over the following fact sheets you were given: IF YOU HAVE QUESTIONS ABOUT YOUR PRE-OP INSTRUCTIONS PLEASE CALL [phone].    If you test positive for Covid or have been in contact with anyone that has tested positive in the last 10 days please notify you surgeon.    [place] - Preparing for Surgery Before surgery, you can play an important role.  Because skin is not sterile, your skin needs to be as free of germs as possible.  You can reduce the number of germs on your skin by washing with CHG (chlorahexidine gluconate) soap before surgery.  CHG is an antiseptic cleaner which kills germs and bonds with the skin to continue killing germs even after washing. Please DO NOT use if you have an allergy to CHG or antibacterial soaps.  If your skin becomes reddened/irritated stop using the CHG and inform your nurse when you arrive at Short Stay. Do not shave (including legs and underarms) for at least 48 hours prior to the first CHG shower.  You may shave your face/neck.  Please follow these instructions carefully:  1.  Shower with CHG Soap the night before surgery ONLY (DO NOT USE THE SOAP THE MORNING OF SURGERY).  2.  If you choose to  wash your hair, wash your hair first as usual with your normal  shampoo.  3.  After you shampoo, rinse your hair and body thoroughly to remove the shampoo.                             4.  Use CHG as you would any other liquid soap.  You can apply chg directly to the skin and wash.  Gently with a scrungie or clean washcloth.  5.  Apply the CHG Soap to your body ONLY FROM THE NECK DOWN.   Do not use on face/ open                           Wound or open sores. Avoid contact with eyes, ears mouth and genitals (private parts).                       Wash face,  Genitals  (private parts) with your normal soap.             6.  Wash thoroughly, paying special attention to the area where your  surgery  will be performed.  7.  Thoroughly rinse your body with warm water  from the neck down.  8.  DO NOT shower/wash with your normal soap after using and rinsing off the CHG Soap.                9.  Pat yourself dry with a clean towel.            10.  Wear clean pajamas.            11.  Place clean sheets on your bed the night of your first shower and do not  sleep with pets. Day of Surgery : Do not apply any CHG, lotions/deodorants the morning of surgery.  Please wear clean clothes to the hospital/surgery center.  FAILURE TO FOLLOW THESE INSTRUCTIONS MAY RESULT IN THE CANCELLATION OF YOUR SURGERY  PATIENT SIGNATURE_________________________________  NURSE SIGNATURE__________________________________  ________________________________________________________________________ WHAT IS A BLOOD TRANSFUSION? Blood Transfusion Information  A transfusion is the replacement of blood or some of its parts. Blood is made up of multiple cells which provide different functions. Red blood cells carry oxygen and are used for blood loss replacement. White blood cells fight against infection. Platelets control bleeding. Plasma helps clot blood. Other blood products are available for specialized needs, such as hemophilia or other clotting disorders. BEFORE THE TRANSFUSION  Who gives blood for transfusions?  Healthy volunteers who are fully evaluated to make sure their blood is safe. This is blood bank blood. Transfusion therapy is the safest it has ever been in the practice of medicine. Before blood is taken from a donor, a complete history is taken to make sure that person has no history of diseases nor engages in risky social behavior (examples are intravenous drug use or sexual activity with multiple partners). The donor's travel history is screened to minimize risk of transmitting  infections, such as malaria. The donated blood is tested for signs of infectious diseases, such as HIV and hepatitis. The blood is then tested to be sure it is compatible with you in order to minimize the chance of a transfusion reaction. If you or a relative donates blood, this is often done in anticipation of surgery and is not appropriate for emergency situations. It takes many days to process the donated  blood. RISKS AND COMPLICATIONS Although transfusion therapy is very safe and saves many lives, the main dangers of transfusion include:  Getting an infectious disease. Developing a transfusion reaction. This is an allergic reaction to something in the blood you were given. Every precaution is taken to prevent this. The decision to have a blood transfusion has been considered carefully by your caregiver before blood is given. Blood is not given unless the benefits outweigh the risks. AFTER THE TRANSFUSION Right after receiving a blood transfusion, you will usually feel much better and more energetic. This is especially true if your red blood cells have gotten low (anemic). The transfusion raises the level of the red blood cells which carry oxygen, and this usually causes an energy increase. The nurse administering the transfusion will monitor you carefully for complications. HOME CARE INSTRUCTIONS  No special instructions are needed after a transfusion. You may find your energy is better. Speak with your caregiver about any limitations on activity for underlying diseases you may have. SEEK MEDICAL CARE IF:  Your condition is not improving after your transfusion. You develop redness or irritation at the intravenous (IV) site. SEEK IMMEDIATE MEDICAL CARE IF:  Any of the following symptoms occur over the next 12 hours: Shaking chills. You have a temperature by mouth above 102 F (38.9 C), not controlled by medicine. Chest, back, or muscle pain. People around you feel you are not acting correctly  or are confused. Shortness of breath or difficulty breathing. Dizziness and fainting. You get a rash or develop hives. You have a decrease in urine output. Your urine turns a dark color or changes to pink, red, or brown. Any of the following symptoms occur over the next 10 days: You have a temperature by mouth above 102 F (38.9 C), not controlled by medicine. Shortness of breath. Weakness after normal activity. The white part of the eye turns yellow (jaundice). You have a decrease in the amount of urine or are urinating less often. Your urine turns a dark color or changes to pink, red, or brown. Document Released: 07/13/2000 Document Revised: 10/08/2011 Document Reviewed: 03/01/2008 [employer] Patient Information 2014 [place], [place].  _______________________________________________________________________  Incentive Spirometer  An incentive spirometer is a tool that can help keep your lungs clear and active. This tool measures how well you are filling your lungs with each breath. Taking long deep breaths may help reverse or decrease the chance of developing breathing (pulmonary) problems (especially infection) following: A long period of time when you are unable to move or be active. BEFORE THE PROCEDURE  If the spirometer includes an indicator to show your best effort, your nurse or respiratory therapist will set it to a desired goal. If possible, sit up straight or lean slightly forward. Try not to slouch. Hold the incentive spirometer in an upright position. INSTRUCTIONS FOR USE  Sit on the edge of your bed if possible, or sit up as far as you can in bed or on a chair. Hold the incentive spirometer in an upright position. Breathe out normally. Place the mouthpiece in your mouth and seal your lips tightly around it. Breathe in slowly and as deeply as possible, raising the piston or the ball toward the top of the column. Hold your breath for 3-5 seconds or for as long as possible.  Allow the piston or ball to fall to the bottom of the column. Remove the mouthpiece from your mouth and breathe out normally. Rest for a few seconds and repeat Steps 1 through  7 at least 10 times every 1-2 hours when you are awake. Take your time and take a few normal breaths between deep breaths. The spirometer may include an indicator to show your best effort. Use the indicator as a goal to work toward during each repetition. After each set of 10 deep breaths, practice coughing to be sure your lungs are clear. If you have an incision (the cut made at the time of surgery), support your incision when coughing by placing a pillow or rolled up towels firmly against it. Once you are able to get out of bed, walk around indoors and cough well. You may stop using the incentive spirometer when instructed by your caregiver.  RISKS AND COMPLICATIONS Take your time so you do not get dizzy or light-headed. If you are in pain, you may need to take or ask for pain medication before doing incentive spirometry. It is harder to take a deep breath if you are having pain. AFTER USE Rest and breathe slowly and easily. It can be helpful to keep track of a log of your progress. Your caregiver can provide you with a simple table to help with this. If you are using the spirometer at home, follow these instructions: SEEK MEDICAL CARE IF:  You are having difficultly using the spirometer. You have trouble using the spirometer as often as instructed. Your pain medication is not giving enough relief while using the spirometer. You develop fever of 100.5 F (38.1 C) or higher. SEEK IMMEDIATE MEDICAL CARE IF:  You cough up bloody sputum that had not been present before. You develop fever of 102 F (38.9 C) or greater. You develop worsening pain at or near the incision site. MAKE SURE YOU:  Understand these instructions. Will watch your condition. Will get help right away if you are not doing well or get  worse. Document Released: 11/26/2006 Document Revised: 10/08/2011 Document Reviewed: 01/27/2007 [facility] Patient Information 2014 [place], [place].   ________________________________________________________________________

## 2024-07-03 ENCOUNTER — Other Ambulatory Visit: Payer: Self-pay

## 2024-07-03 ENCOUNTER — Encounter (HOSPITAL_COMMUNITY)
Admission: RE | Admit: 2024-07-03 | Discharge: 2024-07-03 | Disposition: A | Source: Ambulatory Visit | Attending: Surgery

## 2024-07-03 ENCOUNTER — Encounter (HOSPITAL_COMMUNITY): Payer: Self-pay

## 2024-07-03 VITALS — BP 161/93 | HR 53 | Temp 98.0°F | Resp 17 | Ht 65.0 in | Wt 290.0 lb

## 2024-07-03 DIAGNOSIS — I10 Essential (primary) hypertension: Secondary | ICD-10-CM

## 2024-07-03 DIAGNOSIS — K219 Gastro-esophageal reflux disease without esophagitis: Secondary | ICD-10-CM

## 2024-07-03 HISTORY — DX: Personal history of other diseases of the digestive system: Z87.19

## 2024-07-03 LAB — COMPREHENSIVE METABOLIC PANEL WITH GFR
ALT: 10 U/L (ref 0–44)
AST: 18 U/L (ref 15–41)
Albumin: 4.2 g/dL (ref 3.5–5.0)
Alkaline Phosphatase: 93 U/L (ref 38–126)
Anion gap: 11 (ref 5–15)
BUN: 31 mg/dL — ABNORMAL HIGH (ref 6–20)
CO2: 24 mmol/L (ref 22–32)
Calcium: 9.5 mg/dL (ref 8.9–10.3)
Chloride: 107 mmol/L (ref 98–111)
Creatinine, Ser: 0.96 mg/dL (ref 0.44–1.00)
GFR, Estimated: >60 mL/min (ref >60)
Glucose, Bld: 101 mg/dL — ABNORMAL HIGH (ref 70–99)
Potassium: 4.2 mmol/L (ref 3.5–5.1)
Sodium: 142 mmol/L (ref 135–145)
Total Bilirubin: 0.4 mg/dL (ref 0.0–1.2)
Total Protein: 7.8 g/dL (ref 6.5–8.1)

## 2024-07-03 LAB — CBC WITH DIFFERENTIAL/PLATELET
Abs Immature Granulocytes: 0.02 K/uL (ref 0.00–0.07)
Basophils Absolute: 0.1 K/uL (ref 0.0–0.1)
Basophils Relative: 1 %
Eosinophils Absolute: 0.2 K/uL (ref 0.0–0.5)
Eosinophils Relative: 3 %
HCT: 42.4 % (ref 36.0–46.0)
Hemoglobin: 13.2 g/dL (ref 12.0–15.0)
Immature Granulocytes: 0 %
Lymphocytes Relative: 38 %
Lymphs Abs: 3.2 K/uL (ref 0.7–4.0)
MCH: 29.4 pg (ref 26.0–34.0)
MCHC: 31.1 g/dL (ref 30.0–36.0)
MCV: 94.4 fL (ref 80.0–100.0)
Monocytes Absolute: 0.6 K/uL (ref 0.1–1.0)
Monocytes Relative: 7 %
Neutro Abs: 4.2 K/uL (ref 1.7–7.7)
Neutrophils Relative %: 51 %
Platelets: 157 K/uL (ref 150–400)
RBC: 4.49 MIL/uL (ref 3.87–5.11)
RDW: 12.5 % (ref 11.5–15.5)
Smear Review: NORMAL
WBC: 8.3 K/uL (ref 4.0–10.5)
nRBC: 0 % (ref 0.0–0.2)

## 2024-07-03 LAB — TYPE AND SCREEN
ABO/RH(D): AB POS
Antibody Screen: NEGATIVE

## 2024-07-05 ENCOUNTER — Encounter: Payer: Self-pay | Admitting: Family Medicine

## 2024-07-06 ENCOUNTER — Ambulatory Visit: Admitting: Physician Assistant

## 2024-07-06 ENCOUNTER — Other Ambulatory Visit: Payer: Self-pay

## 2024-07-06 MED ORDER — TRIAMTERENE-HCTZ 37.5-25 MG PO TABS: 1.0000 | ORAL_TABLET | Freq: Every day | ORAL | 1 refills | Status: DC

## 2024-07-07 ENCOUNTER — Other Ambulatory Visit: Payer: Self-pay | Admitting: Physician Assistant

## 2024-07-14 ENCOUNTER — Telehealth (HOSPITAL_COMMUNITY): Payer: Self-pay

## 2024-07-14 ENCOUNTER — Encounter (HOSPITAL_COMMUNITY): Payer: Self-pay | Admitting: Surgery

## 2024-07-14 ENCOUNTER — Encounter (HOSPITAL_COMMUNITY): Admission: RE | Disposition: A | Payer: Self-pay | Source: Ambulatory Visit | Attending: Surgery

## 2024-07-14 ENCOUNTER — Encounter (HOSPITAL_COMMUNITY): Admitting: Medical

## 2024-07-14 ENCOUNTER — Other Ambulatory Visit (HOSPITAL_COMMUNITY): Payer: Self-pay

## 2024-07-14 ENCOUNTER — Inpatient Hospital Stay (HOSPITAL_COMMUNITY)
Admission: RE | Admit: 2024-07-14 | Discharge: 2024-07-15 | DRG: 327 | Disposition: A | Attending: Surgery | Admitting: Surgery

## 2024-07-14 ENCOUNTER — Other Ambulatory Visit: Payer: Self-pay

## 2024-07-14 ENCOUNTER — Inpatient Hospital Stay (HOSPITAL_COMMUNITY): Admitting: Certified Registered Nurse Anesthetist

## 2024-07-14 DIAGNOSIS — J45909 Unspecified asthma, uncomplicated: Secondary | ICD-10-CM

## 2024-07-14 DIAGNOSIS — E119 Type 2 diabetes mellitus without complications: Secondary | ICD-10-CM | POA: Diagnosis present

## 2024-07-14 DIAGNOSIS — K449 Diaphragmatic hernia without obstruction or gangrene: Secondary | ICD-10-CM | POA: Diagnosis present

## 2024-07-14 DIAGNOSIS — Z6841 Body Mass Index (BMI) 40.0 and over, adult: Secondary | ICD-10-CM

## 2024-07-14 DIAGNOSIS — I10 Essential (primary) hypertension: Secondary | ICD-10-CM | POA: Diagnosis present

## 2024-07-14 DIAGNOSIS — Z91018 Allergy to other foods: Secondary | ICD-10-CM | POA: Diagnosis not present

## 2024-07-14 DIAGNOSIS — K219 Gastro-esophageal reflux disease without esophagitis: Secondary | ICD-10-CM | POA: Diagnosis present

## 2024-07-14 DIAGNOSIS — Z79899 Other long term (current) drug therapy: Secondary | ICD-10-CM | POA: Diagnosis not present

## 2024-07-14 DIAGNOSIS — Z7984 Long term (current) use of oral hypoglycemic drugs: Secondary | ICD-10-CM

## 2024-07-14 HISTORY — PX: UPPER GI ENDOSCOPY: SHX6162

## 2024-07-14 HISTORY — PX: LAPAROSCOPIC ROUX-EN-Y GASTRIC BYPASS WITH HIATAL HERNIA REPAIR: SHX6513

## 2024-07-14 LAB — HCG, SERUM, QUALITATIVE: Preg, Serum: NEGATIVE

## 2024-07-14 LAB — POCT PREGNANCY, URINE: Preg Test, Ur: NEGATIVE

## 2024-07-14 SURGERY — CREATION, GASTRIC BYPASS, LAPAROSCOPIC, USING ROUX-EN-Y GASTROENTEROSTOMY, WITH HIATAL HERNIA REPAIR
Anesthesia: General

## 2024-07-14 MED ORDER — SUGAMMADEX SODIUM 200 MG/2ML IV SOLN
INTRAVENOUS | Status: AC
  Filled 2024-07-14: qty 4

## 2024-07-14 MED ORDER — ONDANSETRON HCL 4 MG/2ML IJ SOLN
INTRAMUSCULAR | Status: AC
  Filled 2024-07-14: qty 2

## 2024-07-14 MED ORDER — CHLORHEXIDINE GLUCONATE 4 % EX SOLN: 60.0000 mL | Freq: Once | CUTANEOUS | Status: DC

## 2024-07-14 MED ORDER — BUPIVACAINE-EPINEPHRINE 0.25% -1:200000 IJ SOLN
INTRAMUSCULAR | Status: DC | PRN
  Administered 2024-07-14: 09:00:00 60 mL

## 2024-07-14 MED ORDER — TRAMADOL HCL 50 MG PO TABS: 50.0000 mg | ORAL_TABLET | Freq: Four times a day (QID) | ORAL | Status: DC | PRN

## 2024-07-14 MED ORDER — 0.9 % SODIUM CHLORIDE (POUR BTL) OPTIME
TOPICAL | Status: DC | PRN
  Administered 2024-07-14: 09:00:00 1000 mL

## 2024-07-14 MED ORDER — LIDOCAINE HCL (PF) 2 % IJ SOLN
INTRAMUSCULAR | Status: DC | PRN
  Administered 2024-07-14: 08:00:00 1.5 mg/kg/h via INTRADERMAL
  Administered 2024-07-14: 08:00:00 60 mg via INTRADERMAL

## 2024-07-14 MED ORDER — HYDRALAZINE HCL 20 MG/ML IJ SOLN
INTRAMUSCULAR | Status: AC
  Filled 2024-07-14: qty 1

## 2024-07-14 MED ORDER — SODIUM CHLORIDE 0.9 % IV SOLN
INTRAVENOUS | Status: AC
  Filled 2024-07-14: qty 2

## 2024-07-14 MED ORDER — HEPARIN SODIUM (PORCINE) 5000 UNIT/ML IJ SOLN
5000.0000 [IU] | Freq: Three times a day (TID) | INTRAMUSCULAR | Status: DC
  Administered 2024-07-14 – 2024-07-15 (×2): 5000 [IU] via SUBCUTANEOUS
  Filled 2024-07-14 (×2): qty 1

## 2024-07-14 MED ORDER — APREPITANT 40 MG PO CAPS
ORAL_CAPSULE | ORAL | Status: AC
  Filled 2024-07-14: qty 1

## 2024-07-14 MED ORDER — LIDOCAINE HCL 2 % IJ SOLN
INTRAMUSCULAR | Status: AC
  Filled 2024-07-14: qty 20

## 2024-07-14 MED ORDER — APREPITANT 40 MG PO CAPS: 40.0000 mg | ORAL_CAPSULE | ORAL | Status: DC

## 2024-07-14 MED ORDER — HYDRALAZINE HCL 20 MG/ML IJ SOLN: 10.0000 mg | INTRAMUSCULAR | Status: DC | PRN

## 2024-07-14 MED ORDER — SODIUM CHLORIDE 0.9 % IV SOLN
2.0000 g | INTRAVENOUS | Status: AC
  Administered 2024-07-14: 08:00:00 2 g via INTRAVENOUS

## 2024-07-14 MED ORDER — CARVEDILOL 25 MG PO TABS
25.0000 mg | ORAL_TABLET | Freq: Two times a day (BID) | ORAL | Status: DC
  Administered 2024-07-14 – 2024-07-15 (×2): 25 mg via ORAL
  Filled 2024-07-14 (×2): qty 1

## 2024-07-14 MED ORDER — PROPOFOL 500 MG/50ML IV EMUL
INTRAVENOUS | Status: DC | PRN
  Administered 2024-07-14: 08:00:00 25 ug/kg/min via INTRAVENOUS

## 2024-07-14 MED ORDER — ACETAMINOPHEN 500 MG PO TABS: 1000.0000 mg | ORAL_TABLET | ORAL | Status: AC

## 2024-07-14 MED ORDER — ACETAMINOPHEN 160 MG/5ML PO SOLN: 1000.0000 mg | Freq: Three times a day (TID) | ORAL | Status: DC

## 2024-07-14 MED ORDER — GABAPENTIN 100 MG PO CAPS
100.0000 mg | ORAL_CAPSULE | ORAL | Status: AC
  Administered 2024-07-14: 07:00:00 100 mg via ORAL

## 2024-07-14 MED ORDER — OXYCODONE HCL 5 MG PO TABS: 5.0000 mg | ORAL_TABLET | Freq: Once | ORAL | Status: DC | PRN

## 2024-07-14 MED ORDER — STERILE WATER FOR IRRIGATION IR SOLN
Status: DC | PRN
  Administered 2024-07-14 (×2): 1000 mL

## 2024-07-14 MED ORDER — DOCUSATE SODIUM 100 MG PO CAPS
100.0000 mg | ORAL_CAPSULE | Freq: Two times a day (BID) | ORAL | Status: DC
  Administered 2024-07-14 – 2024-07-15 (×3): 100 mg via ORAL
  Filled 2024-07-14 (×3): qty 1

## 2024-07-14 MED ORDER — ENOXAPARIN (LOVENOX) PATIENT EDUCATION KIT
PACK | Freq: Once | Status: AC
  Filled 2024-07-14: qty 1

## 2024-07-14 MED ORDER — SCOPOLAMINE 1 MG/3DAYS TD PT72: 1.0000 | MEDICATED_PATCH | TRANSDERMAL | Status: DC

## 2024-07-14 MED ORDER — PROPOFOL 1000 MG/100ML IV EMUL
INTRAVENOUS | Status: AC
  Filled 2024-07-14: qty 100

## 2024-07-14 MED ORDER — LIDOCAINE HCL (PF) 2 % IJ SOLN
INTRAMUSCULAR | Status: AC
  Filled 2024-07-14: qty 5

## 2024-07-14 MED ORDER — LACTATED RINGERS IV SOLN: INTRAVENOUS | Status: DC

## 2024-07-14 MED ORDER — EPHEDRINE 5 MG/ML INJ
INTRAVENOUS | Status: AC
  Filled 2024-07-14: qty 5

## 2024-07-14 MED ORDER — FENTANYL CITRATE (PF) 50 MCG/ML IJ SOSY: 25.0000 ug | PREFILLED_SYRINGE | INTRAMUSCULAR | Status: DC | PRN

## 2024-07-14 MED ORDER — HEPARIN SODIUM (PORCINE) 5000 UNIT/ML IJ SOLN: 5000.0000 [IU] | INTRAMUSCULAR | Status: AC

## 2024-07-14 MED ORDER — METHOCARBAMOL 1000 MG/10ML IJ SOLN
500.0000 mg | Freq: Four times a day (QID) | INTRAMUSCULAR | Status: DC | PRN
  Administered 2024-07-14: 17:00:00 500 mg via INTRAVENOUS
  Filled 2024-07-14: qty 10

## 2024-07-14 MED ORDER — ONDANSETRON HCL 4 MG/2ML IJ SOLN
INTRAMUSCULAR | Status: DC | PRN
  Administered 2024-07-14: 10:00:00 4 mg via INTRAVENOUS

## 2024-07-14 MED ORDER — ALBUTEROL SULFATE (2.5 MG/3ML) 0.083% IN NEBU: 3.0000 mL | INHALATION_SOLUTION | Freq: Four times a day (QID) | RESPIRATORY_TRACT | Status: DC | PRN

## 2024-07-14 MED ORDER — CHLORHEXIDINE GLUCONATE 0.12 % MT SOLN
15.0000 mL | Freq: Once | OROMUCOSAL | Status: AC
  Administered 2024-07-14: 07:00:00 15 mL via OROMUCOSAL

## 2024-07-14 MED ORDER — SODIUM CHLORIDE 0.9 % IV SOLN: INTRAVENOUS | Status: DC

## 2024-07-14 MED ORDER — ACETAMINOPHEN 500 MG PO TABS
1000.0000 mg | ORAL_TABLET | Freq: Three times a day (TID) | ORAL | Status: DC
  Administered 2024-07-14 – 2024-07-15 (×3): 1000 mg via ORAL
  Filled 2024-07-14 (×3): qty 2

## 2024-07-14 MED ORDER — OXYCODONE HCL 5 MG/5ML PO SOLN: 5.0000 mg | Freq: Once | ORAL | Status: DC | PRN

## 2024-07-14 MED ORDER — BUPIVACAINE-EPINEPHRINE (PF) 0.25% -1:200000 IJ SOLN
INTRAMUSCULAR | Status: AC
  Filled 2024-07-14: qty 60

## 2024-07-14 MED ORDER — FIBRIN SEALANT 2 ML SINGLE DOSE KIT
2.0000 mL | PACK | Freq: Once | CUTANEOUS | Status: AC
  Administered 2024-07-14: 10:00:00 2 mL via TOPICAL
  Filled 2024-07-14: qty 2

## 2024-07-14 MED ORDER — EPHEDRINE SULFATE (PRESSORS) 25 MG/5ML IV SOSY
PREFILLED_SYRINGE | INTRAVENOUS | Status: DC | PRN
  Administered 2024-07-14: 08:00:00 10 mg via INTRAVENOUS

## 2024-07-14 MED ORDER — KETAMINE HCL 10 MG/ML IJ SOLN: INTRAMUSCULAR | Status: DC | PRN

## 2024-07-14 MED ORDER — VISTASEAL 4 ML SINGLE DOSE KIT
4.0000 mL | PACK | Freq: Once | CUTANEOUS | Status: AC
  Administered 2024-07-14: 10:00:00 4 mL via TOPICAL
  Filled 2024-07-14: qty 4

## 2024-07-14 MED ORDER — GLYCOPYRROLATE 0.2 MG/ML IJ SOLN
INTRAMUSCULAR | Status: AC
  Filled 2024-07-14: qty 1

## 2024-07-14 MED ORDER — ORAL CARE MOUTH RINSE: 15.0000 mL | Freq: Once | OROMUCOSAL | Status: AC

## 2024-07-14 MED ORDER — LACTATED RINGERS IR SOLN
Status: DC | PRN
  Administered 2024-07-14: 10:00:00 1000 mL

## 2024-07-14 MED ORDER — PROPOFOL 10 MG/ML IV BOLUS
INTRAVENOUS | Status: DC | PRN
  Administered 2024-07-14: 08:00:00 200 mg via INTRAVENOUS

## 2024-07-14 MED ORDER — SUGAMMADEX SODIUM 200 MG/2ML IV SOLN
INTRAVENOUS | Status: DC | PRN
  Administered 2024-07-14: 10:00:00 300 mg via INTRAVENOUS

## 2024-07-14 MED ORDER — TOPIRAMATE 25 MG PO TABS
25.0000 mg | ORAL_TABLET | Freq: Three times a day (TID) | ORAL | Status: DC
  Administered 2024-07-14 – 2024-07-15 (×3): 25 mg via ORAL
  Filled 2024-07-14 (×3): qty 1

## 2024-07-14 MED ORDER — HYDROMORPHONE HCL 1 MG/ML IJ SOLN: 0.5000 mg | INTRAMUSCULAR | Status: DC | PRN

## 2024-07-14 MED ORDER — FENTANYL CITRATE (PF) 250 MCG/5ML IJ SOLN
INTRAMUSCULAR | Status: DC | PRN
  Administered 2024-07-14 (×2): 50 ug via INTRAVENOUS
  Administered 2024-07-14: 08:00:00 100 ug via INTRAVENOUS

## 2024-07-14 MED ORDER — SCOPOLAMINE 1 MG/3DAYS TD PT72
MEDICATED_PATCH | TRANSDERMAL | Status: AC
  Administered 2024-07-14: 07:00:00 1 mg via TRANSDERMAL
  Filled 2024-07-14: qty 1

## 2024-07-14 MED ORDER — OXYCODONE HCL 5 MG/5ML PO SOLN
5.0000 mg | Freq: Four times a day (QID) | ORAL | Status: DC | PRN
  Administered 2024-07-14 – 2024-07-15 (×2): 5 mg via ORAL
  Filled 2024-07-14 (×2): qty 5

## 2024-07-14 MED ORDER — SIMETHICONE 80 MG PO CHEW
80.0000 mg | CHEWABLE_TABLET | Freq: Four times a day (QID) | ORAL | Status: DC | PRN
  Administered 2024-07-15: 10:00:00 80 mg via ORAL
  Filled 2024-07-14: qty 1

## 2024-07-14 MED ORDER — ACETAMINOPHEN 500 MG PO TABS
ORAL_TABLET | ORAL | Status: AC
  Administered 2024-07-14: 07:00:00 1000 mg via ORAL
  Filled 2024-07-14: qty 2

## 2024-07-14 MED ORDER — MIDAZOLAM HCL 5 MG/5ML IJ SOLN
INTRAMUSCULAR | Status: DC | PRN
  Administered 2024-07-14: 08:00:00 2 mg via INTRAVENOUS

## 2024-07-14 MED ORDER — ONDANSETRON HCL 4 MG/2ML IJ SOLN: 4.0000 mg | INTRAMUSCULAR | Status: DC | PRN

## 2024-07-14 MED ORDER — PROPOFOL 10 MG/ML IV BOLUS
INTRAVENOUS | Status: AC
  Filled 2024-07-14: qty 20

## 2024-07-14 MED ORDER — GABAPENTIN 100 MG PO CAPS
100.0000 mg | ORAL_CAPSULE | Freq: Two times a day (BID) | ORAL | Status: DC
  Administered 2024-07-14 – 2024-07-15 (×2): 100 mg via ORAL
  Filled 2024-07-14 (×2): qty 1

## 2024-07-14 MED ORDER — ROCURONIUM BROMIDE 10 MG/ML (PF) SYRINGE
PREFILLED_SYRINGE | INTRAVENOUS | Status: AC
  Filled 2024-07-14: qty 10

## 2024-07-14 MED ORDER — HEPARIN SODIUM (PORCINE) 5000 UNIT/ML IJ SOLN
INTRAMUSCULAR | Status: AC
  Administered 2024-07-14: 07:00:00 5000 [IU] via SUBCUTANEOUS
  Filled 2024-07-14: qty 1

## 2024-07-14 MED ORDER — HYDRALAZINE HCL 20 MG/ML IJ SOLN
5.0000 mg | Freq: Once | INTRAMUSCULAR | Status: AC
  Administered 2024-07-14: 12:00:00 5 mg via INTRAVENOUS

## 2024-07-14 MED ORDER — ONDANSETRON HCL 4 MG/2ML IJ SOLN: 4.0000 mg | Freq: Four times a day (QID) | INTRAMUSCULAR | Status: DC | PRN

## 2024-07-14 MED ORDER — GABAPENTIN 100 MG PO CAPS
ORAL_CAPSULE | ORAL | Status: AC
  Filled 2024-07-14: qty 1

## 2024-07-14 MED ORDER — MIDAZOLAM HCL 2 MG/2ML IJ SOLN
INTRAMUSCULAR | Status: AC
  Filled 2024-07-14: qty 2

## 2024-07-14 MED ORDER — KETAMINE HCL 50 MG/5ML IJ SOSY
PREFILLED_SYRINGE | INTRAMUSCULAR | Status: DC | PRN
  Administered 2024-07-14: 08:00:00 20 mg via INTRAVENOUS
  Administered 2024-07-14: 09:00:00 10 mg via INTRAVENOUS

## 2024-07-14 MED ORDER — ROCURONIUM BROMIDE 10 MG/ML (PF) SYRINGE
PREFILLED_SYRINGE | INTRAVENOUS | Status: DC | PRN
  Administered 2024-07-14: 09:00:00 30 mg via INTRAVENOUS
  Administered 2024-07-14: 08:00:00 70 mg via INTRAVENOUS

## 2024-07-14 MED ORDER — GLYCOPYRROLATE 0.2 MG/ML IJ SOLN
INTRAMUSCULAR | Status: DC | PRN
  Administered 2024-07-14: 08:00:00 .2 mg via INTRAVENOUS

## 2024-07-14 MED ORDER — DEXAMETHASONE SOD PHOSPHATE PF 10 MG/ML IJ SOLN
INTRAMUSCULAR | Status: DC | PRN
  Administered 2024-07-14: 08:00:00 10 mg via INTRAVENOUS

## 2024-07-14 MED ORDER — KETAMINE HCL 50 MG/5ML IJ SOSY
PREFILLED_SYRINGE | INTRAMUSCULAR | Status: AC
  Filled 2024-07-14: qty 5

## 2024-07-14 MED ORDER — ENSURE MAX PROTEIN PO LIQD
2.0000 [oz_av] | ORAL | Status: DC
  Administered 2024-07-15 (×3): 2 [oz_av] via ORAL

## 2024-07-14 MED ORDER — AMLODIPINE BESYLATE 10 MG PO TABS
10.0000 mg | ORAL_TABLET | Freq: Every day | ORAL | Status: DC
  Administered 2024-07-15: 10:00:00 10 mg via ORAL
  Filled 2024-07-14: qty 1

## 2024-07-14 MED ORDER — FENTANYL CITRATE (PF) 250 MCG/5ML IJ SOLN
INTRAMUSCULAR | Status: AC
  Filled 2024-07-14: qty 5

## 2024-07-14 MED ORDER — PANTOPRAZOLE SODIUM 40 MG IV SOLR
40.0000 mg | Freq: Every day | INTRAVENOUS | Status: DC
  Administered 2024-07-14: 22:00:00 40 mg via INTRAVENOUS
  Filled 2024-07-14: qty 10

## 2024-07-14 MED ORDER — METOCLOPRAMIDE HCL 5 MG/ML IJ SOLN
10.0000 mg | Freq: Four times a day (QID) | INTRAMUSCULAR | Status: DC
  Administered 2024-07-14 – 2024-07-15 (×5): 10 mg via INTRAVENOUS
  Filled 2024-07-14 (×5): qty 2

## 2024-07-14 SURGICAL SUPPLY — 52 items
APPLICATOR COTTON TIP 6 STRL (MISCELLANEOUS) IMPLANT
APPLICATOR VISTASEAL 35 (MISCELLANEOUS) ×1 IMPLANT
BAG COUNTER SPONGE SURGICOUNT (BAG) IMPLANT
BENZOIN TINCTURE PRP APPL 2/3 (GAUZE/BANDAGES/DRESSINGS) ×1 IMPLANT
BLADE SURG SZ11 CARB STEEL (BLADE) ×1 IMPLANT
BNDG ADH 1X3 SHEER STRL LF (GAUZE/BANDAGES/DRESSINGS) ×6 IMPLANT
CHLORAPREP W/TINT 26 (MISCELLANEOUS) ×2 IMPLANT
CLIP APPLIE ROT 13.4 12 LRG (CLIP) IMPLANT
CLIP SUT LAPRA TY ABSORB (SUTURE) ×2 IMPLANT
COVER SURGICAL LIGHT HANDLE (MISCELLANEOUS) ×1 IMPLANT
DEVICE SUT QUICK LOAD TK 5 (SUTURE) IMPLANT
DEVICE SUT TI-KNOT TK 5X26 (SUTURE) IMPLANT
DEVICE SUTURE ENDOST 10MM (ENDOMECHANICALS) ×1 IMPLANT
DRAIN PENROSE 0.25X18 (DRAIN) ×1 IMPLANT
ELECT REM PT RETURN 15FT ADLT (MISCELLANEOUS) ×1 IMPLANT
GAUZE 4X4 16PLY ~~LOC~~+RFID DBL (SPONGE) ×1 IMPLANT
GLOVE BIO SURGEON STRL SZ 6 (GLOVE) ×1 IMPLANT
GLOVE INDICATOR 6.5 STRL GRN (GLOVE) ×1 IMPLANT
GOWN STRL REUS W/ TWL LRG LVL3 (GOWN DISPOSABLE) ×1 IMPLANT
IRRIGATION SUCT STRKRFLW 2 WTP (MISCELLANEOUS) ×1 IMPLANT
KIT BASIN OR (CUSTOM PROCEDURE TRAY) ×1 IMPLANT
KIT GASTRIC LAVAGE 34FR ADT (SET/KITS/TRAYS/PACK) ×1 IMPLANT
KIT TURNOVER KIT A (KITS) ×1 IMPLANT
MARKER SKIN DUAL TIP RULER LAB (MISCELLANEOUS) ×1 IMPLANT
MAT PREVALON FULL STRYKER (MISCELLANEOUS) ×1 IMPLANT
NDL SPNL 22GX3.5 QUINCKE BK (NEEDLE) ×1 IMPLANT
NEEDLE SPNL 22GX3.5 QUINCKE BK (NEEDLE) ×1 IMPLANT
PACK CARDIOVASCULAR III (CUSTOM PROCEDURE TRAY) ×1 IMPLANT
RELOAD STAPLE 60 2.6 WHT THN (STAPLE) ×2 IMPLANT
RELOAD STAPLE 60 3.6 BLU REG (STAPLE) ×2 IMPLANT
RELOAD STAPLE 60 3.8 GOLD REG (STAPLE) IMPLANT
RELOAD STAPLE 60 4.1 GRN THCK (STAPLE) IMPLANT
RELOAD SUT SNGL STCH ABSRB 2-0 (ENDOMECHANICALS) ×4 IMPLANT
RELOAD SUT SNGL STCH BLK 2-0 (ENDOMECHANICALS) ×4 IMPLANT
SCISSORS LAP 5X45 EPIX DISP (ENDOMECHANICALS) ×1 IMPLANT
SET TUBE SMOKE EVAC HIGH FLOW (TUBING) ×1 IMPLANT
SHEARS HARMONIC 45 ACE (MISCELLANEOUS) ×1 IMPLANT
SLEEVE ADV FIXATION 12X100MM (TROCAR) IMPLANT
SLEEVE ADV FIXATION 5X100MM (TROCAR) ×2 IMPLANT
SOLUTION ANTFG W/FOAM PAD STRL (MISCELLANEOUS) ×1 IMPLANT
STAPLER ECHELON BIOABSB 60 FLE (MISCELLANEOUS) IMPLANT
STAPLER ECHELON LONG 60 440 (INSTRUMENTS) ×1 IMPLANT
STRIP CLOSURE SKIN 1/2X4 (GAUZE/BANDAGES/DRESSINGS) ×1 IMPLANT
SUT MNCRL AB 4-0 PS2 18 (SUTURE) ×1 IMPLANT
SUT SURGIDAC NAB ES-9 0 48 120 (SUTURE) IMPLANT
SUT VIC AB 2-0 SH 27X BRD (SUTURE) ×1 IMPLANT
SYR 20ML LL LF (SYRINGE) ×1 IMPLANT
TOWEL OR DSP ST BLU DLX 10/PK (DISPOSABLE) ×1 IMPLANT
TROCAR ADV FIXATION 12X100MM (TROCAR) ×1 IMPLANT
TROCAR ADV FIXATION 5X100MM (TROCAR) ×1 IMPLANT
TROCAR XCEL NON-BLD 5MMX100MML (ENDOMECHANICALS) ×1 IMPLANT
TUBING CONNECTING 10 (TUBING) IMPLANT

## 2024-07-14 NOTE — Progress Notes (Signed)
°   07/14/24 1617  TOC Brief Assessment  Insurance and Status Reviewed  Patient has primary care physician Yes  Home environment has been reviewed home wtih spouse  Prior level of function: independent  Prior/Current Home Services No current home services  Social Drivers of Health Review SDOH reviewed no interventions necessary  Readmission risk has been reviewed Yes  Transition of care needs no transition of care needs at this time

## 2024-07-14 NOTE — Discharge Instructions (Signed)

## 2024-07-14 NOTE — Progress Notes (Signed)
Discussed [facility]I "Goals for Discharge" document with patient including ambulation in halls, Incentive Spirometry use every hour, and oral care.  Also discussed pain and nausea control.  Enabled or verified head of bed 30 degree alarm activated.  BSTOP education provided including BSTOP information guide, "Guide for Pain Management after your Bariatric Procedure".  Diet progression education provided including "Bariatric Surgery Post-Op Food Plan Phase 1: Liquids".  Questions answered.  Will continue to partner with bedside RN and follow up with patient per protocol.  

## 2024-07-14 NOTE — Plan of Care (Signed)
  Problem: Education: Goal: Knowledge of General Education information will improve Description: Including pain rating scale, medication(s)/side effects and non-pharmacologic comfort measures Outcome: Progressing   Problem: Health Behavior/Discharge Planning: Goal: Ability to manage health-related needs will improve Outcome: Progressing   Problem: Clinical Measurements: Goal: Ability to maintain clinical measurements within normal limits will improve Outcome: Progressing Goal: Will remain free from infection Outcome: Progressing Goal: Diagnostic test results will improve Outcome: Progressing Goal: Respiratory complications will improve Outcome: Progressing Goal: Cardiovascular complication will be avoided Outcome: Progressing   Problem: Activity: Goal: Risk for activity intolerance will decrease Outcome: Progressing   Problem: Nutrition: Goal: Adequate nutrition will be maintained Outcome: Progressing   Problem: Coping: Goal: Level of anxiety will decrease Outcome: Progressing   Problem: Elimination: Goal: Will not experience complications related to bowel motility Outcome: Progressing Goal: Will not experience complications related to urinary retention Outcome: Progressing   Problem: Pain Managment: Goal: General experience of comfort will improve and/or be controlled Outcome: Progressing   Problem: Safety: Goal: Ability to remain free from injury will improve Outcome: Progressing   Problem: Skin Integrity: Goal: Risk for impaired skin integrity will decrease Outcome: Progressing   Problem: Education: Goal: Ability to state signs and symptoms to report to health care provider will improve Outcome: Progressing Goal: Knowledge of the prescribed self-care regimen will improve Outcome: Progressing Goal: Knowledge of discharge needs will improve Outcome: Progressing   Problem: Activity: Goal: Ability to tolerate increased activity will improve Outcome:  Progressing   Problem: Bowel/Gastric: Goal: Gastrointestinal status for postoperative course will improve Outcome: Progressing Goal: Occurrences of nausea will decrease Outcome: Progressing   Problem: Coping: Goal: Development of coping mechanisms to deal with changes in body function or appearance will improve Outcome: Progressing   Problem: Fluid Volume: Goal: Maintenance of adequate hydration will improve Outcome: Progressing   Problem: Nutritional: Goal: Nutritional status will improve Outcome: Progressing   Problem: Clinical Measurements: Goal: Will show no signs or symptoms of venous thromboembolism Outcome: Progressing Goal: Will remain free from infection Outcome: Progressing Goal: Will show no signs of GI Leak Outcome: Progressing   Problem: Respiratory: Goal: Will regain and/or maintain adequate ventilation Outcome: Progressing   Problem: Pain Management: Goal: Pain level will decrease Outcome: Progressing   Problem: Skin Integrity: Goal: Demonstration of wound healing without infection will improve Outcome: Progressing

## 2024-07-14 NOTE — Anesthesia Procedure Notes (Signed)
 Procedure Name: Intubation Date/Time: 07/14/2024 7:38 AM  Performed by: Zulema Leita PARAS, CRNAPre-anesthesia Checklist: Patient identified, Emergency Drugs available, Suction available and Patient being monitored Patient Re-evaluated:Patient Re-evaluated prior to induction Oxygen Delivery Method: Circle system utilized Preoxygenation: Pre-oxygenation with 100% oxygen Induction Type: IV induction Ventilation: Mask ventilation without difficulty Laryngoscope Size: Mac and 4 Grade View: Grade I Tube type: Oral Tube size: 7.0 mm Number of attempts: 1 Airway Equipment and Method: Stylet Placement Confirmation: ETT inserted through vocal cords under direct vision, positive ETCO2 and breath sounds checked- equal and bilateral Secured at: 21 cm Tube secured with: Tape Dental Injury: Teeth and Oropharynx as per pre-operative assessment

## 2024-07-14 NOTE — Anesthesia Preprocedure Evaluation (Signed)
 Anesthesia Evaluation  Patient identified by MRN, date of birth, ID band Patient awake    Reviewed: Allergy & Precautions, H&P , NPO status , Patient's Chart, lab work & pertinent test results  Airway Mallampati: II   Neck ROM: full    Dental   Pulmonary asthma    breath sounds clear to auscultation       Cardiovascular hypertension,  Rhythm:regular Rate:Normal     Neuro/Psych  Headaches  Neuromuscular disease    GI/Hepatic hiatal hernia,GERD  ,,  Endo/Other    Class 4 obesity  Renal/GU      Musculoskeletal  (+) Arthritis ,    Abdominal   Peds  Hematology   Anesthesia Other Findings   Reproductive/Obstetrics                              Anesthesia Physical Anesthesia Plan  ASA: 3  Anesthesia Plan: General   Post-op Pain Management:    Induction: Intravenous  PONV Risk Score and Plan: 3 and Ondansetron , Dexamethasone , Midazolam  and Treatment may vary due to age or medical condition  Airway Management Planned: Oral ETT  Additional Equipment:   Intra-op Plan:   Post-operative Plan: Extubation in OR  Informed Consent: I have reviewed the patients History and Physical, chart, labs and discussed the procedure including the risks, benefits and alternatives for the proposed anesthesia with the patient or authorized representative who has indicated his/her understanding and acceptance.     Dental advisory given  Plan Discussed with: CRNA, Anesthesiologist and Surgeon  Anesthesia Plan Comments:         Anesthesia Quick Evaluation

## 2024-07-14 NOTE — Op Note (Signed)
 Preoperative diagnosis: Roux-en-Y gastric bypass  Postoperative diagnosis: Same   Procedure: Upper endoscopy   Surgeon: Herlene Bureau, M.D.  Anesthesia: Gen.   Indications for procedure: This patient was undergoing a Roux-en-Y gastric bypass.   Description of procedure: The endoscopy was placed in the mouth and into the oropharynx and under endoscopic vision it was advanced to the esophagogastric junction.  The stomach was insufflated and no bleeding or bubbles were seen.  The GEJ was identified at 38 cm from the teeth. The anastomosis was seen at 47 cm and was widely patent and easily traversed. No bleeding or leaks were detected. The scope was withdrawn without difficulty.    Herlene Bureau, M.D. General, Bariatric, & Minimally Invasive Surgery [facility]

## 2024-07-14 NOTE — Progress Notes (Signed)
 PHARMACY CONSULT FOR:  Risk Assessment for Post-Discharge VTE Following Bariatric Surgery  Procedure* laparoscopic Roux-en-Y gastric bypass (antecolic, antegastric), hiatal hernia repair, upper endoscopy   Sex F  Black race Y  Age (years) 77  BMI (kg/m2) 47.09  Operation duration (minutes) 117  History of VTE requiring treatment* N  Hypercoagulable condition* N  Liver disorder* N  Pre-op venous stasis N  Pre-op functional health status Independent   Previous foregut or bariatric surg Y  Post-op surgical site infection N  Transfusion intra- or post-op* N  Unplanned readmission N  Unplanned reoperation N  GI perforation/leak/obstruction* N  *specific risk factors for portomesenteric venous thrombosis   Predicted probability of 30-day post-discharge VTE:    0.51% estimated using the [facility] Calculator  Other patient-specific factors to consider: no   Recommendation for Discharge: Enoxaparin  40 mg [place] q12h x 2 weeks post-discharge  Maureen Flores is a 53 y.o. female who underwent laparoscopic Roux-en-Y gastric bypass (antecolic, antegastric), hiatal hernia repair, upper endoscopy 07/14/2024    Case start: 08:02 Case end: 09:59   Allergies[1]  Patient Measurements: Height: 5' 5 (165.1 cm) Weight: 128.4 kg (283 lb) IBW/kg (Calculated) : 57 Body mass index is 47.09 kg/m.  No results for input(s): WBC, HGB, HCT, PLT, APTT, CREATININE, LABCREA, CREAT24HRUR, MG, PHOS, ALBUMIN, PROT, AST, ALT, ALKPHOS, BILITOT, BILIDIR, IBILI in the last 72 hours. Estimated Creatinine Clearance: 90.5 mL/min (by C-G formula based on SCr of 0.96 mg/dL).    Past Medical History:  Diagnosis Date   Allergy    Coconut   Anemia    Arthritis    Asthma    Back pain    Female infertility    Food allergy    GERD (gastroesophageal reflux disease) 2023   Headache    History of hiatal hernia    Hypertension    Joint pain     Migraines    Obesity    Osteoarthritis    Prediabetes    SOB (shortness of breath)      Medications Prior to Admission  Medication Sig Dispense Refill Last Dose/Taking   albuterol  (VENTOLIN  HFA) 108 (90 Base) MCG/ACT inhaler Inhale 2 puffs into the lungs every 6 (six) hours as needed for shortness of breath or wheezing.   Unknown   amLODipine  (NORVASC ) 10 MG tablet Take 1 tablet (10 mg total) by mouth daily. 90 tablet 1 07/14/2024 Morning   Calcium Citrate-Vitamin D  (CALCIUM CITRATE + D PO) Take 500 mg by mouth 3 (three) times daily.   Past Week   carvedilol  (COREG ) 25 MG tablet Take 25 mg by mouth 2 (two) times daily with a meal.   07/14/2024 Morning   famotidine (PEPCID) 40 MG tablet Take 40 mg by mouth daily.   07/14/2024 Morning   fluticasone (FLONASE) 50 MCG/ACT nasal spray Place 2 sprays into both nostrils daily as needed for allergies or rhinitis.   Past Month   losartan  (COZAAR ) 100 MG tablet Take 0.5 tablets (50 mg total) by mouth daily. (Patient taking differently: Take 100 mg by mouth daily.) 30 tablet 0 07/13/2024   montelukast (SINGULAIR) 10 MG tablet TAKE 1 TABLET BY MOUTH EVERY DAY AT NIGHT 90 tablet 3 Taking   Multiple Vitamins-Minerals (BARIATRIC MULTIVITAMINS/IRON PO) Take 1 tablet by mouth daily at 12 noon. With 45 mg iron   Past Month   simvastatin (ZOCOR) 10 MG tablet Take 10 mg by mouth daily.   07/14/2024 Morning   sucralfate (CARAFATE) 1 g  tablet Take 1 g by mouth 4 (four) times daily.   Past Week   thiamine (VITAMIN B-1) 100 MG tablet Take 100 mg by mouth daily.   Past Month   topiramate  (TOPAMAX ) 25 MG tablet Take 1 tablet (25 mg total) by mouth 3 (three) times daily. 270 tablet 1 07/14/2024 Morning   traMADol  (ULTRAM ) 50 MG tablet TAKE 1 TABLET (50 MG TOTAL) BY MOUTH EVERY 6 (SIX) HOURS AS NEEDED FOR UP TO 30 DOSES. 30 tablet 0 07/14/2024 Morning   triamcinolone (KENALOG) 0.025 % cream Apply 1 application topically daily.   Taking   triamcinolone lotion (KENALOG)  0.1 % Apply 1 application  topically once a week.   Taking   triamterene -hydrochlorothiazide (MAXZIDE-25) 37.5-25 MG tablet Take 1 tablet by mouth daily. 90 tablet 1 07/13/2024    Rosaline IVAR Edison, Pharm.D Use secure chat for questions 07/14/2024 12:36 PM     [1]  Allergies Allergen Reactions   Coconut (Cocos Nucifera) Rash    hives   Other     Cocunut

## 2024-07-14 NOTE — Transfer of Care (Signed)
 Immediate Anesthesia Transfer of Care Note  Patient: Maureen Flores  Procedure(s) Performed: CREATION, GASTRIC BYPASS, LAPAROSCOPIC, USING ROUX-EN-Y GASTROENTEROSTOMY, WITH HIATAL HERNIA REPAIR ENDOSCOPY, UPPER GI TRACT  Patient Location: PACU  Anesthesia Type:General  Level of Consciousness: oriented and drowsy  Airway & Oxygen Therapy: Patient Spontanous Breathing and Patient connected to face mask oxygen  Post-op Assessment: Report given to RN and Post -op Vital signs reviewed and stable  Post vital signs: Reviewed and stable  Last Vitals:  Vitals Value Taken Time  BP 146/104 07/14/24 10:15  Temp    Pulse 67 07/14/24 10:15  Resp 0 07/14/24 10:14  SpO2 100 % 07/14/24 10:15  Vitals shown include unfiled device data.  Last Pain:  Vitals:   07/14/24 0637  TempSrc:   PainSc: 0-No pain         Complications: No notable events documented.

## 2024-07-14 NOTE — Telephone Encounter (Signed)
 Pharmacy Patient Advocate Encounter  Insurance verification completed.    The patient is insured through [facility]. Patient has [employer], may use a copay card, and/or apply for patient assistance if available.    Ran test claim for Lovenox  40mg /0.24ml and the current 30 day co-pay is $35.   This test claim was processed through [facility]- copay amounts may vary at other pharmacies due to [employer], or as the patient moves through the different stages of their insurance plan.

## 2024-07-14 NOTE — Op Note (Addendum)
 Operative Note  Maureen Flores  [id number]  [id number]  07/14/2024   Surgeon: Mitzie DELENA Freund MD   Assistant: Herlene Bureau MD   Procedure performed: laparoscopic Roux-en-Y gastric bypass (antecolic, antegastric), hiatal hernia repair, upper endoscopy   Preop diagnosis: Intractable GERD following sleeve gastrectomy  Post-op diagnosis/intraop findings: same   Specimens: none Retained items: none  EBL: minimal cc Complications: none   Description of procedure: After confirming informed consent and administration of prophylactic heparin  in holding, the patient was taken to the operating room and placed supine on the operating room table where general endotracheal anesthesia was initiated, preoperative antibiotics were administered, SCDs applied, and a formal timeout was performed. The abdomen was prepped and draped in usual sterile fashion. Peritoneal access was gained using a Visiport technique in the left upper quadrant and insufflation to 15 mmHg ensued without issue. Gross inspection revealed no evidence of injury. Under direct visualization the remaining trochars were inserted. A laparoscopic assisted bilateral taps block was performed using bupivicaine.  The patient was then placed in steep reverse Trendelenburg. The liver retractor was inserted through a subxiphoid incision and secured for fixed retraction of the left lobe. A small hiatal hernia was appreciable. The posterior right and left crura were carefully dissected with a combination of harmonic scalpel and blunt dissection. The GE junction was noted to be well below the diaphragm. The hiatal hernia was repaired with one posterior 0 ethibond secured with a ty-knot.  The sleeve was inspected and confirmed there was no residual fundus. The Harmonic scalpel was used to enter the perigastric plane and the lesser sac at a point about 6 cm distal to the GE junction on the lesser curve. After confirming that all tubes have been removed from  the stomach, the sleeve was transected with a gold load linear cutting stapler just proximal to the incisura to created our gastric pouch. This fire left a small undivided margin laterally and a second blue load was required to complete the transection. Hemostasis was ensured along the staple line.  The omentum was reflected cephalad and the ligament of Treitz identified. The small bowel was followed to a point 50 cm distal to ligament of Treitz at which location the bowel was divided with a white load linear cutting stapler. A Penrose was sutured to the Roux side of the staple line for future identification. The bowel was measured another 100 cm distal to this and and the site for the jejunojejunostomy was aligned with the end of the biliopancreatic limb. Enterotomies were made with the Harmonic scalpel and the anastomosis was created with the 60 mm white load linear cutting stapler. The common enterotomy was closed with running 3-0 Vicryl starting on either end and tying centrally. The mesenteric defect was closed with running silk suture secured with Lapra-Ty's. The anastomosis was inspected and appeared widely patent, hemostatic with no gaps in the suture line. Vistaseal  was sprayed over the anastomosis. We then divided the omentum using the harmonic scalpel.  The Roux limb with its attached Penrose drain was then identified and brought up to meet the gastric pouch ensuring no twist in the small bowel mesentery. The staple line of the small bowel is directed to the patient's left side. A running 3-0 Vicryl was used to create a posterior suture line for our anastomosis between the gastric pouch and the small bowel. Gastrotomy and enterotomy was made with the Harmonic scalpel and a blue load linear cutting stapler was used to create a gastrojejunal anastomosis approximately  2.5cm wide. The common enterotomy was closed with running 3-0 Vicryl starting at either end and tying centrally. At this juncture the Ewald  tube was passed through the gastrojejunal anastomosis. An anterior second layer of running 3-0 Vicryl was used to complete the gastrojejunal anastomosis. The ewald tube was removed without difficulty. The [place] space was closed with a figure-of-eight silk suture.  At this point the assistant performed an upper endoscopy with the Roux limb gently clamped with a bowel clamp. Irrigation was instilled in the upper abdomen for a leak test. Please see his separate operative note- the anastomosis is noted to be patent, intact and hemostatic without any leak or bubbles present. The endoscope was removed and the abdomen once again surveyed. The liver retractor was removed under direct visualization. The abdomen was then desufflated and all remaining trochars removed. The skin incisions were closed with subcuticular 4-0 Monocryl; benzoin, Steri-Strips and Band-Aids were applied The patient was then awakened, extubated and taken to PACU in stable condition.     All counts were correct at the completion of the case.

## 2024-07-14 NOTE — Interval H&P Note (Signed)
 History and Physical Interval Note:  07/14/2024 7:03 AM  Maureen Flores  has presented today for surgery, with the diagnosis of MORBID OBESITY.  The various methods of treatment have been discussed with the patient and family. After consideration of risks, benefits and other options for treatment, the patient has consented to  Procedures with comments: CREATION, GASTRIC BYPASS, LAPAROSCOPIC, USING ROUX-EN-Y GASTROENTEROSTOMY, WITH HIATAL HERNIA REPAIR (N/A) - LAPAROSCOPIC GASTRIC BYPASS RNY, UPPER GI ENDOSCOPY, HIATAL HERNIA REPAIR ENDOSCOPY, UPPER GI TRACT (N/A) as a surgical intervention.  The patient's history has been reviewed, patient examined, no change in status, stable for surgery.  I have reviewed the patient's chart and labs.  Questions were answered to the patient's satisfaction.     [name] DELENA Freund

## 2024-07-15 ENCOUNTER — Encounter (HOSPITAL_COMMUNITY): Payer: Self-pay | Admitting: Surgery

## 2024-07-15 ENCOUNTER — Other Ambulatory Visit (HOSPITAL_COMMUNITY): Payer: Self-pay

## 2024-07-15 LAB — COMPREHENSIVE METABOLIC PANEL WITH GFR
ALT: 37 U/L (ref 0–44)
AST: 36 U/L (ref 15–41)
Albumin: 4 g/dL (ref 3.5–5.0)
Alkaline Phosphatase: 92 U/L (ref 38–126)
Anion gap: 12 (ref 5–15)
BUN: 26 mg/dL — ABNORMAL HIGH (ref 6–20)
CO2: 24 mmol/L (ref 22–32)
Calcium: 9.9 mg/dL (ref 8.9–10.3)
Chloride: 103 mmol/L (ref 98–111)
Creatinine, Ser: 1.16 mg/dL — ABNORMAL HIGH (ref 0.44–1.00)
GFR, Estimated: 56 mL/min — ABNORMAL LOW (ref >60)
Glucose, Bld: 94 mg/dL (ref 70–99)
Potassium: 3.7 mmol/L (ref 3.5–5.1)
Sodium: 140 mmol/L (ref 135–145)
Total Bilirubin: 0.3 mg/dL (ref 0.0–1.2)
Total Protein: 7.5 g/dL (ref 6.5–8.1)

## 2024-07-15 LAB — CBC
HCT: 39.5 % (ref 36.0–46.0)
Hemoglobin: 13.4 g/dL (ref 12.0–15.0)
MCH: 29.3 pg (ref 26.0–34.0)
MCHC: 33.9 g/dL (ref 30.0–36.0)
MCV: 86.2 fL (ref 80.0–100.0)
Platelets: 310 K/uL (ref 150–400)
RBC: 4.58 MIL/uL (ref 3.87–5.11)
RDW: 12.3 % (ref 11.5–15.5)
WBC: 10.4 K/uL (ref 4.0–10.5)
nRBC: 0 % (ref 0.0–0.2)

## 2024-07-15 LAB — MAGNESIUM: Magnesium: 2.3 mg/dL (ref 1.7–2.4)

## 2024-07-15 MED ORDER — ACETAMINOPHEN 500 MG PO TABS: 1000.0000 mg | ORAL_TABLET | Freq: Three times a day (TID) | ORAL | Status: AC

## 2024-07-15 MED ORDER — OXYCODONE HCL 5 MG PO TABS
5.0000 mg | ORAL_TABLET | Freq: Three times a day (TID) | ORAL | 0 refills | Status: AC | PRN
  Filled 2024-07-15: qty 15, 5d supply, fill #0

## 2024-07-15 MED ORDER — ENOXAPARIN SODIUM 40 MG/0.4ML IJ SOSY
40.0000 mg | PREFILLED_SYRINGE | Freq: Two times a day (BID) | INTRAMUSCULAR | 0 refills | Status: AC
  Filled 2024-07-15: qty 11.2, 14d supply, fill #0

## 2024-07-15 MED ORDER — GABAPENTIN 100 MG PO CAPS
100.0000 mg | ORAL_CAPSULE | Freq: Two times a day (BID) | ORAL | 0 refills | Status: AC
  Filled 2024-07-15: qty 10, 5d supply, fill #0

## 2024-07-15 MED ORDER — PANTOPRAZOLE SODIUM 40 MG PO TBEC
40.0000 mg | DELAYED_RELEASE_TABLET | Freq: Every day | ORAL | 0 refills | Status: AC
  Filled 2024-07-15: qty 30, 30d supply, fill #0

## 2024-07-15 MED ORDER — ONDANSETRON 4 MG PO TBDP
4.0000 mg | ORAL_TABLET | Freq: Four times a day (QID) | ORAL | 0 refills | Status: AC | PRN
  Filled 2024-07-15: qty 20, 5d supply, fill #0

## 2024-07-15 NOTE — Progress Notes (Signed)
 Patient alert and oriented, pain is controlled. Patient is tolerating fluids, advanced to protein shake today, patient is tolerating well. Reviewed Gastric sleeve/bypass discharge instructions with patient and patient is able to articulate understanding. Lovenox  education provided along with patient education kit from pharmacy.  Patient able to articulate Lovenox  injections to include when and how to take medication.  Provided information on BELT program, Support Group, BSTOP-D, and [facility] outpatient pharmacy. Communicated general update of patient status to surgeon. All questions answered. 24hr fluid recall is 840 per hydration protocol, bariatric nurse coordinator to make follow-up phone call within one week.

## 2024-07-15 NOTE — Progress Notes (Signed)
 S: Feeling well. Headache and some referred gas pain this AM but no nausea or reflux sx. Has almost finished her protein and has walked quite a bit  O: Vitals, labs, intake/output, and orders reviewed at this time. Afebrile,m no tachycardia, sats 100% RA. Some HTN. PO 660, UOP 3100. CMP- Cr up minimally to 1.16; otherwise unremarkable. WBC 10.4 (8.3 preop), Hgb 13.4 (13.2), PLT 310 (157)   Gen: A&Ox3, no distress  H&N: EOMI, atraumatic, neck supple Chest: unlabored respirations, RRR Abd: soft, nontender, nondistended, incision(s) c/d/i without cellulitis or hematoma Ext: warm, no edema Neuro: grossly normal  Lines/tubes/drains: PIV  A/P: POD 1 s/p laparoscopic roux en y gastric bypass conversion from sleeve gastrectomy for intractable reflux -Continue clears, protein shakes -Ambulate, SCDs while in bed, SQH and aggressive pulm toilet -Discharge home today    Mitzie Freund, MD [facility], [place]

## 2024-07-15 NOTE — Progress Notes (Addendum)
 Patient's meds were delivered to the room.   Patient was stable for discharge and was taken to the main exit by wheelchair.

## 2024-07-15 NOTE — Anesthesia Postprocedure Evaluation (Signed)
 Anesthesia Post Note  Patient: Maureen Flores  Procedure(s) Performed: CREATION, GASTRIC BYPASS, LAPAROSCOPIC, USING ROUX-EN-Y GASTROENTEROSTOMY, WITH HIATAL HERNIA REPAIR ENDOSCOPY, UPPER GI TRACT     Patient location during evaluation: PACU Anesthesia Type: General Level of consciousness: awake and alert Pain management: pain level controlled Vital Signs Assessment: post-procedure vital signs reviewed and stable Respiratory status: spontaneous breathing, nonlabored ventilation, respiratory function stable and patient connected to nasal cannula oxygen Cardiovascular status: blood pressure returned to baseline and stable Postop Assessment: no apparent nausea or vomiting Anesthetic complications: no   No notable events documented.  Last Vitals:  Vitals:   07/15/24 0530 07/15/24 0949  BP: (!) 157/76 135/68  Pulse: 64 62  Resp: 18 18  Temp: 37 C 37.3 C  SpO2: 100% 98%    Last Pain:  Vitals:   07/15/24 0949  TempSrc: Oral  PainSc:                  Elia Nunley S

## 2024-07-15 NOTE — Plan of Care (Signed)
   Problem: Clinical Measurements: Goal: Will remain free from infection Outcome: Progressing Goal: Diagnostic test results will improve Outcome: Progressing

## 2024-07-16 ENCOUNTER — Telehealth: Payer: Self-pay | Admitting: Dietician

## 2024-07-16 ENCOUNTER — Telehealth: Payer: Self-pay

## 2024-07-16 NOTE — Telephone Encounter (Signed)
 Returned pt call 2 days post bariatric surgery, regarding the bariatric multivitamins starting after surgery. Pt had ordered a different multivitamin than was recommended. Pt's questions were answered to patients satisfactions, and patient expressed understanding.

## 2024-07-16 NOTE — Transitions of Care (Post Inpatient/ED Visit) (Signed)
 07/16/2024  Name: Maureen Flores MRN: [id number] DOB: 1970-10-12  Today's TOC FU Call Status: Today's TOC FU Call Status:: Successful TOC FU Call Completed TOC FU Call Complete Date: 07/16/24  Patient's Name and Date of Birth confirmed. Name, DOB  Transition Care Management Follow-up Telephone Call Date of Discharge: 07/15/24 Discharge Facility: [facility] [address]) Type of Discharge: Inpatient Admission Primary Inpatient Discharge Diagnosis:: GERD How have you been since you were released from the hospital?: Better Any questions or concerns?: No  Items Reviewed: Did you receive and understand the discharge instructions provided?: Yes Medications obtained,verified, and reconciled?: Yes (Medications Reviewed) Any new allergies since your discharge?: No Dietary orders reviewed?: Yes Do you have support at home?: Yes People in Home [RPT]: spouse, other relative(s)  Medications Reviewed Today: Medications Reviewed Today     Reviewed by Emmitt Pan, LPN (Licensed Practical Nurse) on 07/16/24 at [phone]  Med List Status: <None>   Medication Order Taking? Sig Documenting Provider Last Dose Status Informant  acetaminophen  (TYLENOL ) 500 MG tablet [id number] Yes Take 2 tablets (1,000 mg total) by mouth every 8 (eight) hours for 5 days. [name], MD  Active   albuterol  (VENTOLIN  HFA) 108 [id number] Base) MCG/ACT inhaler [id number] Yes Inhale 2 puffs into the lungs every 6 (six) hours as needed for shortness of breath or wheezing. [provider]  Active Self  amLODipine  (NORVASC ) 10 MG tablet [id number] Yes Take 1 tablet (10 mg total) by mouth daily. Aletha Bene, MD  Active Self  Calcium Citrate-Vitamin D  (CALCIUM CITRATE + D PO) [id number] Yes Take 500 mg by mouth 3 (three) times daily. [provider]  Active Self  carvedilol  (COREG ) 25 MG tablet [id number] Yes Take 25 mg by mouth 2 (two) times daily with a meal. [provider]  Active Self  enoxaparin   (LOVENOX ) 40 MG/0.4ML injection [id number] Yes Inject 0.4 mLs (40 mg total) into the skin every 12 (twelve) hours. [name], MD  Active   fluticasone OREN) 50 MCG/ACT nasal spray [id number] Yes Place 2 sprays into both nostrils daily as needed for allergies or rhinitis. [provider]  Active Self  gabapentin  (NEURONTIN ) 100 MG capsule [id number] Yes Take 1 capsule (100 mg total) by mouth every 12 (twelve) hours. [name], MD  Active   losartan  (COZAAR ) 100 MG tablet [id number] Yes Take 0.5 tablets (50 mg total) by mouth daily.  Patient taking differently: Take 100 mg by mouth daily.   [name], MD  Active Self  montelukast (SINGULAIR) 10 MG tablet [id number] Yes TAKE 1 TABLET BY MOUTH EVERY DAY AT ADA Aletha Bene, MD  Active Self  Multiple Vitamins-Minerals (BARIATRIC MULTIVITAMINS/IRON PO) [id number] Yes Take 1 tablet by mouth daily at 12 noon. With 45 mg iron [provider]  Active Self  ondansetron  (ZOFRAN -ODT) 4 MG disintegrating tablet [id number] Yes Dissolve 1 tablet (4 mg total) by mouth every 6 (six) hours as needed for nausea or vomiting. [name], MD  Active   oxyCODONE  (ROXICODONE ) 5 MG immediate release tablet [id number] Yes Take 1 tablet (5 mg total) by mouth every 8 (eight) hours as needed for up to 5 days. Take narcotic pain medication only if needed for severe/ breakthrough pain. [name], MD  Active   pantoprazole  (PROTONIX ) 40 MG tablet [id number] Yes Take 1 tablet (40 mg total) by mouth daily. [name], MD  Active   simvastatin (ZOCOR) 10 MG tablet [id number] Yes Take 10 mg by mouth daily. [provider]  Active Self  thiamine (VITAMIN B-1) 100 MG tablet [id number] Yes Take 100 mg by mouth daily. [provider]  Active Self  topiramate  (TOPAMAX ) 25 MG tablet [id number] Yes Take 1 tablet (25 mg total) by mouth 3 (three) times daily. Aletha Bene, MD  Active Self  traMADol  (ULTRAM ) 50 MG  tablet [id number] Yes TAKE 1 TABLET (50 MG TOTAL) BY MOUTH EVERY 6 (SIX) HOURS AS NEEDED FOR UP TO 30 DOSES. Gretta Bertrum ORN, PA-C  Active   triamcinolone (KENALOG) 0.025 % cream [id number] Yes Apply 1 application topically daily. [provider]  Active Self  triamcinolone lotion (KENALOG) 0.1 % [id number] Yes Apply 1 application  topically once a week. [provider]  Active Self            Home Care and Equipment/Supplies: Were Home Health Services Ordered?: NA Any new equipment or medical supplies ordered?: NA  Functional Questionnaire: Do you need assistance with bathing/showering or dressing?: No Do you need assistance with meal preparation?: No Do you need assistance with eating?: No Do you have difficulty maintaining continence: No Do you need assistance with getting out of bed/getting out of a chair/moving?: No Do you have difficulty managing or taking your medications?: No  Follow up appointments reviewed: PCP Follow-up appointment confirmed?: Yes Date of PCP follow-up appointment?: 07/28/24 Follow-up Provider: [facility] Follow-up appointment confirmed?: Yes Date of Specialist follow-up appointment?: 08/12/24 Follow-Up Specialty Provider:: surgery Do you need transportation to your follow-up appointment?: No Do you understand care options if your condition(s) worsen?: Yes-patient verbalized understanding    SIGNATURE Julian Lemmings, LPN [facility] Nurse Health Advisor Direct Dial [phone]

## 2024-07-16 NOTE — Discharge Summary (Signed)
 Physician Discharge Summary  Maureen Flores FMW:[id number] DOB: February 28, 1971 DOA: 07/14/2024  PCP: Aletha Bene, MD  Admit date: 07/14/2024 Discharge date: 07/15/2024  Recommendations for Outpatient Follow-up:     Follow-up Information     [name], MD. Go on 08/12/2024.   Specialty: General Surgery Why: @ 130 pm.  Please arrive 15 minutes prior to discharge.  Thank you Contact information: [address] [place] [address] [phone]         [name], MD. Go on 09/10/2024.   Specialty: General Surgery Why: @ 1140.  Please arrive 15 minutes prior to appointment.  Thank you Contact information: [address] [place] [address] [phone]                Discharge Diagnoses:  Principal Problem:   GERD (gastroesophageal reflux disease)   Surgical Procedure: Laparoscopic Roux-en-Y gastric bypass, upper endoscopy  Discharge Condition: Good Disposition: Home  Diet recommendation: Postoperative gastric bypass diet  Filed Weights   07/14/24 0626  Weight: 128.4 kg     Hospital Course:  The patient was admitted for a planned laparoscopic Roux-en-Y gastric bypass. Please see operative note. Preoperatively the patient was given 5000 units of subcutaneous heparin  for DVT prophylaxis. ERAS protocol was used. Postoperative prophylactic heparin  dosing was started on the evening of postoperative day 0.  The patient was started on ice chips and water  on the evening of POD 0 which they tolerated. On postoperative day 1 The patient's diet was advanced to protein shakes which they also tolerated. The patient was ambulating without difficulty. Their vital signs are stable without fever or tachycardia. Their hemoglobin had remained stable. The patient had received discharge instructions and counseling. They were deemed stable for discharge.  BP 135/68 (BP Location: Left Arm)   Pulse 62   Temp 98.8 F (37.1 C)   Resp 18    Ht 5' 5 (1.651 m)   Wt 128.4 kg   SpO2 98%   BMI 47.09 kg/m     Discharge Instructions   Allergies as of 07/15/2024       Reactions   Coconut (cocos Nucifera) Rash   hives   Other    Cocunut        Medication List     STOP taking these medications    famotidine 40 MG tablet Commonly known as: PEPCID   sucralfate 1 g tablet Commonly known as: CARAFATE   triamterene -hydrochlorothiazide 37.5-25 MG tablet Commonly known as: MAXZIDE-25       TAKE these medications    acetaminophen  500 MG tablet Commonly known as: TYLENOL  Take 2 tablets (1,000 mg total) by mouth every 8 (eight) hours for 5 days.   albuterol  108 (90 Base) MCG/ACT inhaler Commonly known as: VENTOLIN  HFA Inhale 2 puffs into the lungs every 6 (six) hours as needed for shortness of breath or wheezing.   amLODipine  10 MG tablet Commonly known as: NORVASC  Take 1 tablet (10 mg total) by mouth daily. Notes to patient: Monitor Blood Pressure Daily and keep a log for primary care physician.  You may need to make changes to your medications with rapid weight loss.      BARIATRIC MULTIVITAMINS/IRON PO Take 1 tablet by mouth daily at 12 noon. With 45 mg iron   CALCIUM CITRATE + D PO Take 500 mg by mouth 3 (three) times daily.   carvedilol  25 MG tablet Commonly known as: COREG  Take 25 mg by mouth 2 (two) times  daily with a meal. Notes to patient: Monitor Blood Pressure Daily and keep a log for primary care physician.  You may need to make changes to your medications with rapid weight loss.      enoxaparin  40 MG/0.4ML injection Commonly known as: LOVENOX  Inject 0.4 mLs (40 mg total) into the skin every 12 (twelve) hours.   fluticasone 50 MCG/ACT nasal spray Commonly known as: FLONASE Place 2 sprays into both nostrils daily as needed for allergies or rhinitis.   gabapentin  100 MG capsule Commonly known as: NEURONTIN  Take 1 capsule (100 mg total) by mouth every 12 (twelve) hours.   losartan  100  MG tablet Commonly known as: Cozaar  Take 0.5 tablets (50 mg total) by mouth daily. What changed: how much to take   montelukast 10 MG tablet Commonly known as: SINGULAIR TAKE 1 TABLET BY MOUTH EVERY DAY AT NIGHT   ondansetron  4 MG disintegrating tablet Commonly known as: ZOFRAN -ODT Dissolve 1 tablet (4 mg total) by mouth every 6 (six) hours as needed for nausea or vomiting.   oxyCODONE  5 MG immediate release tablet Commonly known as: Roxicodone  Take 1 tablet (5 mg total) by mouth every 8 (eight) hours as needed for up to 5 days. Take narcotic pain medication only if needed for severe/ breakthrough pain.   pantoprazole  40 MG tablet Commonly known as: PROTONIX  Take 1 tablet (40 mg total) by mouth daily.   simvastatin 10 MG tablet Commonly known as: ZOCOR Take 10 mg by mouth daily.   thiamine 100 MG tablet Commonly known as: Vitamin B-1 Take 100 mg by mouth daily.   topiramate  25 MG tablet Commonly known as: TOPAMAX  Take 1 tablet (25 mg total) by mouth 3 (three) times daily.   traMADol  50 MG tablet Commonly known as: ULTRAM  TAKE 1 TABLET (50 MG TOTAL) BY MOUTH EVERY 6 (SIX) HOURS AS NEEDED FOR UP TO 30 DOSES.   triamcinolone 0.025 % cream Commonly known as: KENALOG Apply 1 application topically daily.   triamcinolone lotion 0.1 % Commonly known as: KENALOG Apply 1 application  topically once a week.        Follow-up Information     [name], MD. Go on 08/12/2024.   Specialty: General Surgery Why: @ 130 pm.  Please arrive 15 minutes prior to discharge.  Thank you Contact information: [address] [place] [address] [phone]         [name], MD. Go on 09/10/2024.   Specialty: General Surgery Why: @ 1140.  Please arrive 15 minutes prior to appointment.  Thank you Contact information: [address] [place] [address] [phone]                  The results of significant diagnostics  from this hospitalization (including imaging, microbiology, ancillary and laboratory) are listed below for reference.    Significant Diagnostic Studies: No results found.  Labs: Basic Metabolic Panel: Recent Labs  Lab 07/15/24 0505  NA 140  K 3.7  CL 103  CO2 24  GLUCOSE 94  BUN 26*  CREATININE 1.16*  CALCIUM 9.9  MG 2.3   Liver Function Tests: Recent Labs  Lab 07/15/24 0505  AST 36  ALT 37  ALKPHOS 92  BILITOT 0.3  PROT 7.5  ALBUMIN 4.0    CBC: Recent Labs  Lab 07/15/24 0505  WBC 10.4  HGB 13.4  HCT 39.5  MCV 86.2  PLT 310    CBG: No results for  input(s): GLUCAP in the last 168 hours.  Principal Problem:   GERD (gastroesophageal reflux disease)  Signed:  Mitzie DELENA Freund MD [facility] [phone] 07/16/2024, 9:27 AM

## 2024-07-17 ENCOUNTER — Other Ambulatory Visit (HOSPITAL_COMMUNITY): Payer: Self-pay

## 2024-07-17 DIAGNOSIS — Z9884 Bariatric surgery status: Secondary | ICD-10-CM | POA: Insufficient documentation

## 2024-07-21 ENCOUNTER — Telehealth (HOSPITAL_COMMUNITY): Payer: Self-pay

## 2024-07-21 NOTE — Telephone Encounter (Signed)
 1. Tell me about your pain and pain management?    Pt denies any pain.   2. Let's talk about fluid intake. How much total fluid are you taking in?   Pt states that s/he is getting in at least 64oz of fluid including protein shakes, bottled water , and broth 3. How much protein have you taken in the last day?     Pt states she is meeting the goal of 60g of protein each day with the protein shakes   4. Have you had nausea? Tell me about when you have experienced nausea and what you did to help?   Pt denies nausea.   5. Has the frequency or color changed with your urine?   Pt states that s/he is urinating fine with no changes in frequency or urgency.   6. Tell me what your incisions look like?   Incisions look fine. Pt denies a fever, chills. Pt states incisions are not swollen, open, or draining. Pt encouraged to call [facility] if incisions change.    7. Have you been passing gas? BM?   Pt states that they are having BMs. Last BM 07/21/2024  Pt states that they have had a BM. Pt instructed to take either Miralax or MoM as instructed per Gastric Bypass/Sleeve Discharge Home Care Instructions. Pt to call surgeon's office if not able to have BM with medication.     8. If a problem or question were to arise who would you call? Do you know contact numbers for [facility], [facility], and [facility]?   Pt knows to call [facility] for surgical, [facility] for nutrition, and [facility] for non-urgent questions or concerns. Pt denies dehydration symptoms. Pt can describe s/sx of dehydration.   9. How has the walking going?   Pt states s/he is walking around and able to be active without difficulty.   10. Are you still using your incentive spirometer? If so, how often?   Pt states that s/he is doing the I.S. Pt encouraged to use incentive spirometer, at least 10x every hour while awake until s/he sees the surgeon.   11. How are your vitamins and calcium going? How are you taking them?    Pt states that s/he is taking  his/her supplements and vitamins without difficulty.   12. How has the anticoagulant Lovenox  been going?   LOVENOX : Pt states that s/he is taking the Lovenox  injections without difficulty. Reinforced education about taking injections q12h and rotating injection sites. Pt also instructed to monitor for unusual bruising and/or signs of bleeding.   Reminded patient that the first 30 days post-operatively are important for successful recovery. Practice good hand hygiene, and minimizing exposure to people who live outside of the home, especially if they are exhibiting any respiratory, GI, or illness-like symptoms.

## 2024-07-28 ENCOUNTER — Encounter: Payer: Self-pay | Admitting: Dietician

## 2024-07-28 ENCOUNTER — Encounter: Admitting: Dietician

## 2024-07-28 VITALS — Ht 65.0 in | Wt 268.3 lb

## 2024-07-28 DIAGNOSIS — E669 Obesity, unspecified: Secondary | ICD-10-CM | POA: Diagnosis present

## 2024-07-28 NOTE — Progress Notes (Signed)
 2 Week Post-Operative Nutrition Class   Class start Time: 1520   Class End Time: 1622  This was a class of 8 patients.   Patient was seen on 07/28/2024 for Post-Operative Nutrition education at the [facility].    Surgery date: 07/14/2024 Surgery type: Conversion from Sleeve to RYGB  Anthropometrics  Start weight at [facility]: 315.4 lbs (date: 06/21/2023)  Height: 65 in Weight today: 268.3 lbs   Clinical   Medical hx: HTN, GERD, asthma, obesity Medications: metformin , losarten, gabapentin  Labs: A1c 5.6; chol 118; HDL 42; LDL 58 Notable signs/symptoms: none noted Any previous deficiencies? No Bowel Habits: Every day to every other day no complaints   Body Composition Scale 07/28/2024  Current Body Weight 268.3  Total Body Fat % 47.1  Visceral Fat 19  Fat-Free Mass % 52.8   Total Body Water  % 40.9  Muscle-Mass lbs 31.7  BMI 44.3  Body Fat Displacement          Torso  lbs 78.5         Left Leg  lbs 15.7         Right Leg  lbs 15.7         Left Arm  lbs 7.8         Right Arm  lbs 7.8    The following the learning objectives were met by the patient during this course: Identifies Soft Prepped Plan Advancement Guide  Identifies Soft, High Proteins (Phase 1), beginning 2 weeks post-operatively to 3 weeks post-operatively Identifies Additional Soft High Proteins, soft non-starchy vegetables, fruits and starches (Phase 2), beginning 3 weeks post-operatively to 3 months post-operatively Identifies appropriate sources of fluids, proteins, vegetables, fruits and starches Identifies appropriate fat sources and healthy verses unhealthy fat types   States protein, vegetable, fruit and starch recommendations and appropriate sources post-operatively Identifies the need for appropriate texture modifications, mastication, and bite sizes when consuming solids Identifies appropriate fat consumption and sources Identifies appropriate multivitamin and calcium sources  post-operatively Describes the need for physical activity post-operatively and will follow MD recommendations States when to call healthcare provider regarding medication questions or post-operative complications   Handouts given during class include: Soft Prepped Plan Advancement Guide   Follow-Up Plan: Patient will follow-up at [facility] in 10 weeks for 3 month post-op nutrition visit for diet advancement per MD.

## 2024-07-29 ENCOUNTER — Encounter: Payer: Self-pay | Admitting: Family Medicine

## 2024-07-29 ENCOUNTER — Ambulatory Visit: Admitting: Family Medicine

## 2024-07-29 VITALS — BP 136/82 | HR 65 | Ht 65.0 in | Wt 269.0 lb

## 2024-07-29 DIAGNOSIS — R600 Localized edema: Secondary | ICD-10-CM

## 2024-07-29 DIAGNOSIS — K219 Gastro-esophageal reflux disease without esophagitis: Secondary | ICD-10-CM

## 2024-07-29 DIAGNOSIS — I10 Essential (primary) hypertension: Secondary | ICD-10-CM | POA: Diagnosis not present

## 2024-07-29 DIAGNOSIS — Z9884 Bariatric surgery status: Secondary | ICD-10-CM | POA: Diagnosis not present

## 2024-07-29 DIAGNOSIS — R5383 Other fatigue: Secondary | ICD-10-CM

## 2024-07-29 DIAGNOSIS — N289 Disorder of kidney and ureter, unspecified: Secondary | ICD-10-CM

## 2024-07-29 DIAGNOSIS — Z09 Encounter for follow-up examination after completed treatment for conditions other than malignant neoplasm: Secondary | ICD-10-CM | POA: Diagnosis not present

## 2024-07-29 LAB — CBC WITH DIFFERENTIAL/PLATELET
Absolute Lymphocytes: 2457 {cells}/uL (ref 850–3900)
Absolute Monocytes: 506 {cells}/uL (ref 200–950)
Basophils Absolute: 47 {cells}/uL (ref 0–200)
Basophils Relative: 0.6 %
Eosinophils Absolute: 166 {cells}/uL (ref 15–500)
Eosinophils Relative: 2.1 %
HCT: 39.4 % (ref 35.9–46.0)
Hemoglobin: 13 g/dL (ref 11.7–15.5)
MCH: 29 pg (ref 27.0–33.0)
MCHC: 33 g/dL (ref 31.6–35.4)
MCV: 87.8 fL (ref 81.4–101.7)
MPV: 12.1 fL (ref 7.5–12.5)
Monocytes Relative: 6.4 %
Neutro Abs: 4724 {cells}/uL (ref 1500–7800)
Neutrophils Relative %: 59.8 %
Platelets: 286 Thousand/uL (ref 140–400)
RBC: 4.49 Million/uL (ref 3.80–5.10)
RDW: 12.9 % (ref 11.0–15.0)
Total Lymphocyte: 31.1 %
WBC: 7.9 Thousand/uL (ref 3.8–10.8)

## 2024-07-29 LAB — COMPREHENSIVE METABOLIC PANEL WITH GFR
AG Ratio: 1.3 (calc) (ref 1.0–2.5)
ALT: 21 U/L (ref 6–29)
AST: 19 U/L (ref 10–35)
Albumin: 4.1 g/dL (ref 3.6–5.1)
Alkaline phosphatase (APISO): 75 U/L (ref 37–153)
BUN: 17 mg/dL (ref 7–25)
CO2: 26 mmol/L (ref 20–32)
Calcium: 9.9 mg/dL (ref 8.6–10.4)
Chloride: 106 mmol/L (ref 98–110)
Creat: 0.94 mg/dL (ref 0.50–1.03)
Globulin: 3.2 g/dL (ref 1.9–3.7)
Glucose, Bld: 84 mg/dL (ref 65–99)
Potassium: 3.8 mmol/L (ref 3.5–5.3)
Sodium: 142 mmol/L (ref 135–146)
Total Bilirubin: 0.5 mg/dL (ref 0.2–1.2)
Total Protein: 7.3 g/dL (ref 6.1–8.1)
eGFR: 72 mL/min/1.73m2

## 2024-07-29 LAB — IRON,TIBC AND FERRITIN PANEL
%SAT: 13 % — ABNORMAL LOW (ref 16–45)
Ferritin: 112 ng/mL (ref 16–232)
Iron: 34 ug/dL — ABNORMAL LOW (ref 45–160)
TIBC: 263 ug/dL (ref 250–450)

## 2024-07-29 NOTE — Progress Notes (Unsigned)
 "  Patient Office Visit  Assessment & Plan:  Follow-up examination after abdominal surgery  History of Roux-en-Y gastric bypass  Primary hypertension -     CBC with Differential/Platelet -     Comprehensive metabolic panel with GFR  Abnormal kidney function -     Comprehensive metabolic panel with GFR  Edema of extremities -     CBC with Differential/Platelet -     Comprehensive metabolic panel with GFR  Other fatigue -     CBC with Differential/Platelet -     Iron, TIBC and Ferritin Panel   Assessment and Plan    Postoperative care following Roux-en-Y gastric bypass with hiatal hernia repair Incisions healing with tenderness, no nausea or vomiting, rapid transit of protein drinks not considered dumping syndrome, tolerating soft foods, low energy with dizziness on exertion, weight loss noted, no complications. - Continue follow-up with nutritionist for dietary management. - Scheduled follow-up with surgeon on January 14th. - Encouraged gradual increase in protein intake as tolerated. - Advised to avoid overexertion to prevent dizziness and faintness.  Primary hypertension Blood pressure well-controlled with current medication, occasional elevated readings due to missed doses, triamterene  discontinued. - Continue current antihypertensive regimen with carvedilol  and amlodipine . - Monitor blood pressure daily.  Abnormal kidney function Mildly abnormal kidney function possibly due to dehydration post-surgery, stable hemoglobin levels. - Ordered repeat kidney function tests to monitor postoperative status. - Ordered CBC to check for anemia post-surgery.     Test results were reviewed and analyzed as part of the medical decision making of this visit. Reviewed labs and hospital notes during office visit.  Patient will advance diet as recommended by nutritionist/surgery. Follow up on lab work and notify patient.  Return in about 2 months (around 09/26/2024), or if symptoms worsen  or fail to improve.   Subjective:    Patient ID: Maureen Flores, female    DOB: July 10, 1971  Age: 53 y.o. MRN: [id number]  Chief Complaint  Patient presents with   Medical Management of Chronic Issues    Pt had gastric bypass on 12/16. Pt is here to discuss if medication changes are needed.    HPI Discussed the use of AI scribe software for clinical note transcription with the patient, who gave verbal consent to proceed.      History of Present Illness Maureen Flores is a 53 year old female who presents for postoperative follow-up after laparoscopic surgery for hiatal hernia repair and 2nd bariatric surgery Roux and Y done 2 weeks ago.   She underwent laparoscopic surgery for 2nd bariatric and hiatal hernia repair and has six incisions, with three still tender, particularly the one on the right side. Discomfort occurs when her bra rubs against the incisions, described as 'tender' and 'sore'.  Postoperatively, she has not experienced nausea or vomiting. Protein drinks increase bowel movements, though not to the point of diarrhea. She has started on soft foods, such as refried beans, which are well tolerated. She feels full quickly, consuming only small amounts at a time.  Her energy levels are low, with exhaustion following household chores, leading to faintness and dizziness. Current medications include pantoprazole , carvedilol , and amlodipine . She reports checking her blood pressure daily, with the highest reading being around 140/90, which occurred after missing a dose of carvedilol .  She has lost weight since surgery, now weighing 269 pounds, down from 283 pounds on December 16th. Her target weight is between 190 and 200 pounds. She is working with a health and safety inspector, aiming for a protein  intake of at least 60 grams per day and a caloric intake of 600-800 calories per day.  Physical Exam MEASUREMENTS: Weight- 269 (initial weight was over 400) ABDOMEN: Bowel sounds present, not  hyperactive.  Results Labs Renal function: Mildly abnormal, possibly due to mild dehydration Hgb: Within normal limits  Assessment and Plan Postoperative care following Roux-en-Y gastric bypass with hiatal hernia repair Incisions healing with tenderness, no nausea or vomiting, rapid transit of protein drinks not considered dumping syndrome, tolerating soft foods, low energy with dizziness on exertion, weight loss noted, no complications. - Continue follow-up with nutritionist for dietary management. - Scheduled follow-up with surgeon on January 14th. - Encouraged gradual increase in protein intake as tolerated. - Advised to avoid overexertion to prevent dizziness and faintness.  Primary hypertension Blood pressure well-controlled with current medication, occasional elevated readings due to missed doses, triamterene  discontinued per general surgery - Continue current antihypertensive regimen with carvedilol  and amlodipine . - Monitor blood pressure daily.  Abnormal kidney function Mildly abnormal kidney function possibly due to dehydration post-surgery, stable hemoglobin levels. - Ordered repeat kidney function tests to monitor postoperative status. - Ordered CBC and iron panel to check for anemia post-surgery.    The 10-year ASCVD risk score (Arnett DK, et al., 2019) is: 12.6%  Past Medical History:  Diagnosis Date   Allergy    Coconut   Anemia    Arthritis    Asthma    Back pain    Diabetes mellitus without complication [facility])    Female infertility    Food allergy    GERD (gastroesophageal reflux disease) 2023   Headache    History of hiatal hernia    Hypertension    Joint pain    Migraines    Obesity    Osteoarthritis    Prediabetes    SOB (shortness of breath)    Past Surgical History:  Procedure Laterality Date   66 HOUR PH STUDY N/A 05/15/2023   Procedure: 24 HOUR PH STUDY;  Surgeon: Shila Gustav GAILS, MD;  Location: [facility];  Service: Gastroenterology;   Laterality: N/A;   CESAREAN SECTION     ESOPHAGEAL MANOMETRY N/A 05/15/2023   Procedure: ESOPHAGEAL MANOMETRY (EM);  Surgeon: Shila Gustav GAILS, MD;  Location: [facility];  Service: Gastroenterology;  Laterality: N/A;   LAPAROSCOPIC GASTRIC SLEEVE RESECTION N/A 04/17/2021   Procedure: LAPAROSCOPIC GASTRIC SLEEVE RESECTION;  Surgeon: [name], MD;  Location: [facility];  Service: General;  Laterality: N/A;   LAPAROSCOPIC ROUX-EN-Y GASTRIC BYPASS WITH HIATAL HERNIA REPAIR N/A 07/14/2024   Procedure: CREATION, GASTRIC BYPASS, LAPAROSCOPIC, USING ROUX-EN-Y GASTROENTEROSTOMY, WITH HIATAL HERNIA REPAIR;  Surgeon: [name], MD;  Location: [facility];  Service: General;  Laterality: N/A;  LAPAROSCOPIC GASTRIC BYPASS RNY, UPPER GI ENDOSCOPY, HIATAL HERNIA REPAIR   UPPER GI ENDOSCOPY N/A 04/17/2021   Procedure: UPPER GI ENDOSCOPY;  Surgeon: [name], MD;  Location: [facility];  Service: General;  Laterality: N/A;   UPPER GI ENDOSCOPY N/A 07/14/2024   Procedure: ENDOSCOPY, UPPER GI TRACT;  Surgeon: [name], MD;  Location: [facility];  Service: General;  Laterality: N/A;   WISDOM TOOTH EXTRACTION     Social History[1] Family History  Problem Relation Age of Onset   Stroke Mother    Thyroid disease Mother    Sleep apnea Mother    Alcoholism Mother    Drug abuse Mother    Asthma Mother    COPD Mother    Allergies[2]  ROS  Objective:    BP 136/82   Pulse 65   Ht 5' 5 (1.651 m)   Wt 269 lb (122 kg)   LMP 07/24/2023 (Approximate)   SpO2 99%   BMI 44.76 kg/m  BP Readings from Last 3 Encounters:  07/29/24 136/82  07/15/24 135/68  07/03/24 (!) 161/93   Wt Readings from Last 3 Encounters:  07/29/24 269 lb (122 kg)  07/28/24 268 lb 4.8 oz (121.7 kg)  07/14/24 283 lb (128.4 kg)    Physical Exam Vitals and nursing note reviewed.  Constitutional:      General: She is not in acute distress.    Appearance: Normal appearance.  HENT:     Head: Normocephalic.      Right Ear: Tympanic membrane, ear canal and external ear normal.     Left Ear: Tympanic membrane, ear canal and external ear normal.  Eyes:     Extraocular Movements: Extraocular movements intact.     Conjunctiva/sclera: Conjunctivae normal.     Pupils: Pupils are equal, round, and reactive to light.  Cardiovascular:     Rate and Rhythm: Normal rate and regular rhythm.     Heart sounds: Normal heart sounds.  Pulmonary:     Effort: Pulmonary effort is normal.     Breath sounds: Normal breath sounds.  Abdominal:     General: Bowel sounds are normal.     Tenderness: There is abdominal tenderness. There is no guarding or rebound.     Comments: Small Surgical incisions with steri strips noted from recent surgery. Minimal tenderness noted epigastric area. Patient is holding her abdomen on top  Musculoskeletal:     Right lower leg: Edema present.     Left lower leg: Edema present.     Comments: Nonpitting edema noted over right ankle. No skin breakdown  Neurological:     General: No focal deficit present.     Mental Status: She is alert and oriented to person, place, and time.  Psychiatric:        Mood and Affect: Mood normal.        Behavior: Behavior normal.        Thought Content: Thought content normal.        Judgment: Judgment normal.      Results for orders placed or performed in visit on 07/29/24  CBC with Differential/Platelet  Result Value Ref Range   WBC 7.9 3.8 - 10.8 Thousand/uL   RBC 4.49 3.80 - 5.10 Million/uL   Hemoglobin 13.0 11.7 - 15.5 g/dL   HCT 60.5 64.0 - 53.9 %   MCV 87.8 81.4 - 101.7 fL   MCH 29.0 27.0 - 33.0 pg   MCHC 33.0 31.6 - 35.4 g/dL   RDW 87.0 88.9 - 84.9 %   Platelets 286 140 - 400 Thousand/uL   MPV 12.1 7.5 - 12.5 fL   Neutro Abs 4,724 1,500 - 7,800 cells/uL   Absolute Lymphocytes 2,457 850 - 3,900 cells/uL   Absolute Monocytes 506 200 - 950 cells/uL   Eosinophils Absolute 166 15 - 500 cells/uL   Basophils Absolute 47 0 - 200 cells/uL    Neutrophils Relative % 59.8 %   Total Lymphocyte 31.1 %   Monocytes Relative 6.4 %   Eosinophils Relative 2.1 %   Basophils Relative 0.6 %  Iron, TIBC and Ferritin Panel  Result Value Ref Range   Iron 34 (L) 45 - 160 mcg/dL   TIBC 736 749 - 549 mcg/dL (calc)   %SAT 13 (L) 16 -  45 % (calc)   Ferritin 112 16 - 232 ng/mL  Comprehensive metabolic panel with GFR  Result Value Ref Range   Glucose, Bld 84 65 - 99 mg/dL   BUN 17 7 - 25 mg/dL   Creat 9.05 9.49 - 8.96 mg/dL   eGFR 72 > OR = 60 fO/fpw/8.26f7   BUN/Creatinine Ratio SEE NOTE: 6 - 22 (calc)   Sodium 142 135 - 146 mmol/L   Potassium 3.8 3.5 - 5.3 mmol/L   Chloride 106 98 - 110 mmol/L   CO2 26 20 - 32 mmol/L   Calcium 9.9 8.6 - 10.4 mg/dL   Total Protein 7.3 6.1 - 8.1 g/dL   Albumin 4.1 3.6 - 5.1 g/dL   Globulin 3.2 1.9 - 3.7 g/dL (calc)   AG Ratio 1.3 1.0 - 2.5 (calc)   Total Bilirubin 0.5 0.2 - 1.2 mg/dL   Alkaline phosphatase (APISO) 75 37 - 153 U/L   AST 19 10 - 35 U/L   ALT 21 6 - 29 U/L            [1]  Social History Tobacco Use   Smoking status: Never   Smokeless tobacco: Never   Tobacco comments:    Never  Vaping Use   Vaping status: Never Used  Substance Use Topics   Alcohol use: Not Currently    Comment: social wine   Drug use: No  [2]  Allergies Allergen Reactions   Coconut (Cocos Nucifera) Rash    hives   Other     Cocunut   "

## 2024-07-30 ENCOUNTER — Ambulatory Visit: Payer: Self-pay | Admitting: Family Medicine

## 2024-08-04 ENCOUNTER — Telehealth: Payer: Self-pay | Admitting: Dietician

## 2024-08-04 NOTE — Telephone Encounter (Signed)
 RD called pt to verify fluid intake once starting soft, solid proteins 2 week post-bariatric surgery.   Daily Fluid intake: 64 oz (48 just plain water ) Daily Protein intake: 60 or 60+ grams Bowel Habits: normal  Concerns/issues: going good; tolerating food better than protein shakes

## 2024-08-28 ENCOUNTER — Encounter: Payer: Self-pay | Admitting: Gastroenterology

## 2024-10-05 ENCOUNTER — Ambulatory Visit: Admitting: Family Medicine

## 2024-10-12 ENCOUNTER — Encounter: Admitting: Dietician
